# Patient Record
Sex: Male | Born: 1940 | Race: White | Hispanic: No | Marital: Married | Smoking: Never smoker
Health system: Southern US, Community
[De-identification: ages and names within clinical notes are randomized; demographics above are authoritative.]

## PROBLEM LIST (undated history)

## (undated) DIAGNOSIS — E785 Hyperlipidemia, unspecified: Secondary | ICD-10-CM

## (undated) DIAGNOSIS — D369 Benign neoplasm, unspecified site: Secondary | ICD-10-CM

## (undated) DIAGNOSIS — I10 Essential (primary) hypertension: Secondary | ICD-10-CM

## (undated) HISTORY — DX: Essential (primary) hypertension: I10

## (undated) HISTORY — DX: Hyperlipidemia, unspecified: E78.5

## (undated) HISTORY — DX: Benign neoplasm, unspecified site: D36.9

---

## 1981-10-30 HISTORY — PX: APPENDECTOMY: SHX54

## 2004-10-30 DIAGNOSIS — D369 Benign neoplasm, unspecified site: Secondary | ICD-10-CM

## 2004-10-30 HISTORY — DX: Benign neoplasm, unspecified site: D36.9

## 2005-02-08 ENCOUNTER — Ambulatory Visit: Payer: Self-pay | Admitting: Gastroenterology

## 2005-02-20 ENCOUNTER — Ambulatory Visit: Payer: Self-pay | Admitting: Gastroenterology

## 2005-07-06 ENCOUNTER — Encounter: Admission: RE | Admit: 2005-07-06 | Discharge: 2005-07-06 | Payer: Self-pay | Admitting: Family Medicine

## 2006-10-25 ENCOUNTER — Ambulatory Visit: Payer: Self-pay | Admitting: Family Medicine

## 2006-10-25 LAB — CONVERTED CEMR LAB
ALT: 28 units/L (ref 0–40)
AST: 27 units/L (ref 0–37)
Alkaline Phosphatase: 42 units/L (ref 39–117)
Basophils Relative: 0.1 % (ref 0.0–1.0)
CO2: 30 meq/L (ref 19–32)
Cholesterol: 145 mg/dL (ref 0–200)
Creatinine, Ser: 0.9 mg/dL (ref 0.4–1.5)
Creatinine,U: 218.9 mg/dL
GFR calc non Af Amer: 91 mL/min
Glomerular Filtration Rate, Af Am: 110 mL/min/{1.73_m2}
HCT: 44.1 % (ref 39.0–52.0)
HDL: 44.6 mg/dL (ref >39.0)
Hgb A1c MFr Bld: 6 % (ref 4.6–6.0)
Lymphocytes Relative: 23.8 % (ref 12.0–46.0)
MCV: 88.5 fL (ref 78.0–100.0)
Microalb Creat Ratio: 7.3 mg/g (ref 0.0–30.0)
Microalb, Ur: 1.6 mg/dL (ref 0.0–1.9)
Monocytes Absolute: 0.6 10*3/uL (ref 0.2–0.7)
Platelets: 243 10*3/uL (ref 150–400)
Potassium: 3.1 meq/L — ABNORMAL LOW (ref 3.5–5.1)
RDW: 13.2 % (ref 11.5–14.6)
Sodium: 137 meq/L (ref 135–145)
Total Bilirubin: 0.8 mg/dL (ref 0.3–1.2)
Total Protein: 6.7 g/dL (ref 6.0–8.3)
Triglyceride fasting, serum: 96 mg/dL (ref 0–149)
VLDL: 19 mg/dL (ref 0–40)

## 2006-11-08 ENCOUNTER — Ambulatory Visit: Payer: Self-pay | Admitting: Family Medicine

## 2006-11-12 ENCOUNTER — Ambulatory Visit: Payer: Self-pay | Admitting: Internal Medicine

## 2007-11-25 ENCOUNTER — Ambulatory Visit: Payer: Self-pay | Admitting: Family Medicine

## 2007-11-25 LAB — CONVERTED CEMR LAB
Albumin: 3.7 g/dL (ref 3.5–5.2)
Alkaline Phosphatase: 41 units/L (ref 39–117)
BUN: 12 mg/dL (ref 6–23)
Basophils Absolute: 0 10*3/uL (ref 0.0–0.1)
CO2: 30 meq/L (ref 19–32)
Chloride: 99 meq/L (ref 96–112)
Creatinine,U: 106.1 mg/dL
GFR calc non Af Amer: 90 mL/min
Glucose, Bld: 142 mg/dL — ABNORMAL HIGH (ref 70–99)
Glucose, Urine, Semiquant: NEGATIVE
Hemoglobin: 14.3 g/dL (ref 13.0–17.0)
Hgb A1c MFr Bld: 6.6 % — ABNORMAL HIGH (ref 4.6–6.0)
LDL Cholesterol: 125 mg/dL — ABNORMAL HIGH (ref 0–99)
Lymphocytes Relative: 24 % (ref 12.0–46.0)
MCHC: 32.9 g/dL (ref 30.0–36.0)
MCV: 88.1 fL (ref 78.0–100.0)
Microalb Creat Ratio: 80.1 mg/g — ABNORMAL HIGH (ref 0.0–30.0)
Microalb, Ur: 8.5 mg/dL — ABNORMAL HIGH (ref 0.0–1.9)
Monocytes Relative: 7.2 % (ref 3.0–11.0)
Neutro Abs: 4.5 10*3/uL (ref 1.4–7.7)
Potassium: 4.1 meq/L (ref 3.5–5.1)
RBC: 4.94 M/uL (ref 4.22–5.81)
RDW: 13.4 % (ref 11.5–14.6)
Sodium: 138 meq/L (ref 135–145)
Specific Gravity, Urine: 1.02
TSH: 1.3 microintl units/mL (ref 0.35–5.50)
Total CHOL/HDL Ratio: 5.2
Triglycerides: 112 mg/dL (ref 0–149)
VLDL: 22 mg/dL (ref 0–40)
pH: 7

## 2007-11-28 ENCOUNTER — Ambulatory Visit: Payer: Self-pay | Admitting: Family Medicine

## 2007-11-28 DIAGNOSIS — E785 Hyperlipidemia, unspecified: Secondary | ICD-10-CM | POA: Insufficient documentation

## 2007-11-28 DIAGNOSIS — IMO0002 Reserved for concepts with insufficient information to code with codable children: Secondary | ICD-10-CM

## 2007-11-28 DIAGNOSIS — E119 Type 2 diabetes mellitus without complications: Secondary | ICD-10-CM

## 2008-03-31 ENCOUNTER — Ambulatory Visit: Payer: Self-pay | Admitting: Family Medicine

## 2008-05-12 ENCOUNTER — Ambulatory Visit: Payer: Self-pay | Admitting: Family Medicine

## 2008-05-12 LAB — CONVERTED CEMR LAB: Hgb A1c MFr Bld: 7 % — ABNORMAL HIGH (ref 4.6–6.0)

## 2008-07-14 ENCOUNTER — Encounter: Payer: Self-pay | Admitting: Family Medicine

## 2008-07-14 ENCOUNTER — Ambulatory Visit: Payer: Self-pay | Admitting: Family Medicine

## 2008-07-27 DIAGNOSIS — J0110 Acute frontal sinusitis, unspecified: Secondary | ICD-10-CM

## 2008-07-30 ENCOUNTER — Ambulatory Visit: Payer: Self-pay | Admitting: Family Medicine

## 2008-07-30 DIAGNOSIS — J320 Chronic maxillary sinusitis: Secondary | ICD-10-CM | POA: Insufficient documentation

## 2008-08-31 ENCOUNTER — Telehealth: Payer: Self-pay | Admitting: Family Medicine

## 2008-12-22 ENCOUNTER — Telehealth: Payer: Self-pay | Admitting: Family Medicine

## 2008-12-30 ENCOUNTER — Ambulatory Visit: Payer: Self-pay | Admitting: Family Medicine

## 2009-03-09 ENCOUNTER — Telehealth: Payer: Self-pay | Admitting: Family Medicine

## 2009-06-24 ENCOUNTER — Ambulatory Visit: Payer: Self-pay | Admitting: Family Medicine

## 2009-06-24 LAB — CONVERTED CEMR LAB
BUN: 16 mg/dL (ref 6–23)
Calcium: 8.8 mg/dL (ref 8.4–10.5)
Glucose, Bld: 123 mg/dL — ABNORMAL HIGH (ref 70–99)

## 2009-07-02 ENCOUNTER — Telehealth: Payer: Self-pay | Admitting: Family Medicine

## 2009-11-18 ENCOUNTER — Telehealth: Payer: Self-pay | Admitting: Family Medicine

## 2009-11-23 ENCOUNTER — Telehealth: Payer: Self-pay | Admitting: Family Medicine

## 2010-02-07 ENCOUNTER — Encounter (INDEPENDENT_AMBULATORY_CARE_PROVIDER_SITE_OTHER): Payer: Self-pay | Admitting: *Deleted

## 2010-03-30 ENCOUNTER — Encounter: Payer: Self-pay | Admitting: Family Medicine

## 2010-06-28 ENCOUNTER — Ambulatory Visit: Payer: Self-pay | Admitting: Family Medicine

## 2010-06-28 LAB — CONVERTED CEMR LAB
ALT: 33 units/L (ref 0–53)
AST: 28 units/L (ref 0–37)
Basophils Absolute: 0.1 10*3/uL (ref 0.0–0.1)
CO2: 30 meq/L (ref 19–32)
Calcium: 9.4 mg/dL (ref 8.4–10.5)
Creatinine, Ser: 0.8 mg/dL (ref 0.4–1.5)
Creatinine,U: 316.9 mg/dL
Eosinophils Absolute: 0.2 10*3/uL (ref 0.0–0.7)
Eosinophils Relative: 2.5 % (ref 0.0–5.0)
Glucose, Bld: 121 mg/dL — ABNORMAL HIGH (ref 70–99)
HCT: 44.1 % (ref 39.0–52.0)
Lymphocytes Relative: 20 % (ref 12.0–46.0)
MCHC: 34.2 g/dL (ref 30.0–36.0)
Microalb, Ur: 3.4 mg/dL — ABNORMAL HIGH (ref 0.0–1.9)
Monocytes Absolute: 0.6 10*3/uL (ref 0.1–1.0)
Monocytes Relative: 7.1 % (ref 3.0–12.0)
Neutro Abs: 5.6 10*3/uL (ref 1.4–7.7)
Neutrophils Relative %: 69.7 % (ref 43.0–77.0)
Nitrite: NEGATIVE
PSA: 0.44 ng/mL (ref 0.10–4.00)
Platelets: 244 10*3/uL (ref 150.0–400.0)
Potassium: 4.1 meq/L (ref 3.5–5.1)
RBC: 4.87 M/uL (ref 4.22–5.81)
RDW: 13.8 % (ref 11.5–14.6)
Specific Gravity, Urine: 1.025
Total Bilirubin: 0.5 mg/dL (ref 0.3–1.2)
WBC Urine, dipstick: NEGATIVE
pH: 6

## 2010-07-12 ENCOUNTER — Ambulatory Visit: Payer: Self-pay | Admitting: Family Medicine

## 2010-07-15 ENCOUNTER — Encounter: Payer: Self-pay | Admitting: Family Medicine

## 2010-07-19 ENCOUNTER — Ambulatory Visit: Payer: Self-pay | Admitting: Family Medicine

## 2010-08-02 ENCOUNTER — Telehealth: Payer: Self-pay | Admitting: Family Medicine

## 2010-08-02 DIAGNOSIS — L820 Inflamed seborrheic keratosis: Secondary | ICD-10-CM

## 2010-08-29 ENCOUNTER — Encounter: Payer: Self-pay | Admitting: Family Medicine

## 2010-08-29 ENCOUNTER — Telehealth: Payer: Self-pay | Admitting: Family Medicine

## 2010-11-27 LAB — CONVERTED CEMR LAB
AST: 21 units/L (ref 0–37)
Albumin: 3.8 g/dL (ref 3.5–5.2)
Basophils Absolute: 0 10*3/uL (ref 0.0–0.1)
Basophils Relative: 0 % (ref 0.0–3.0)
Chloride: 100 meq/L (ref 96–112)
Cholesterol: 134 mg/dL (ref 0–200)
Creatinine,U: 340.8 mg/dL
Eosinophils Relative: 2.3 % (ref 0.0–5.0)
GFR calc Af Amer: 125 mL/min
Glucose, Bld: 105 mg/dL — ABNORMAL HIGH (ref 70–99)
HCT: 42.9 % (ref 39.0–52.0)
Hemoglobin: 14.4 g/dL (ref 13.0–17.0)
Hgb A1c MFr Bld: 6.2 % — ABNORMAL HIGH (ref 4.6–6.0)
LDL Cholesterol: 78 mg/dL (ref 0–99)
Lymphocytes Relative: 25.5 % (ref 12.0–46.0)
MCHC: 33.5 g/dL (ref 30.0–36.0)
MCV: 89.8 fL (ref 78.0–100.0)
Microalb, Ur: 2.3 mg/dL — ABNORMAL HIGH (ref 0.0–1.9)
Monocytes Absolute: 0.5 10*3/uL (ref 0.1–1.0)
Monocytes Relative: 7.5 % (ref 3.0–12.0)
Neutro Abs: 4.3 10*3/uL (ref 1.4–7.7)
PSA: 0.51 ng/mL (ref 0.10–4.00)
Platelets: 212 10*3/uL (ref 150–400)
Potassium: 3.6 meq/L (ref 3.5–5.1)
TSH: 0.99 microintl units/mL (ref 0.35–5.50)
Total Bilirubin: 0.8 mg/dL (ref 0.3–1.2)
Triglycerides: 83 mg/dL (ref 0–149)
Urobilinogen, UA: 1
pH: 6.5

## 2010-11-30 NOTE — Progress Notes (Signed)
  Phone Note Outgoing Call   Summary of Call: I called [name], and left him a message inflamed seborrheic keratosis benign lesion Initial call taken by: Roderick Pee MD,  August 02, 2010 8:21 AM

## 2010-11-30 NOTE — Assessment & Plan Note (Signed)
Summary: 1 wk rov/mm   Procedure Note Last Tetanus: Tdap (07/12/2010)  Mole Biopsy/Removal: Indication: rule out cancer Consent signed: yes  Procedure # 1: elliptical incision with 2 mm margin    Size (in cm): 1.0 x 1.0    Region: anterior    Location: scalp    Instrument used: #15 blade    Anesthesia: 1% lidocaine w/epinephrine    Closure: cautery  CC: mole removal   CC:  mole removal.  History of Present Illness: James [name] is a 70 year old, married male, nonsmoker, who comes in to have a lesion removed from his right hairline.Marland Kitchen  He is also noted to have hypertension.  His blood pressures not been under good control.  He was advised to check it daily and bring a record of all his blood pressure readings with him to however, is only checked it twice, and then bring a record of his readings nor the device.  We will ask him to repeat the process  Allergies: No Known Drug Allergies   Complete Medication List: 1)  Fish Oil Oil (Fish oil) 2)  Adult Aspirin Ec Low Strength 81 Mg Tbec (Aspirin) 3)  Verapamil Hcl Cr 240 Mg Cp24 (Verapamil hcl) .... Take 1 tablet by mouth once a day 4)  Mvi  5)  Hydrochlorothiazide 25 Mg Tabs (Hydrochlorothiazide) .... Take 1 tablet by mouth once a day 6)  Zestril 40 Mg Tabs (Lisinopril) .... Take 1 tablet by mouth every morning 7)  Onetouch Ultra Test Strp (Glucose blood) .... Test daily as directed 8)  Zocor 40 Mg Tabs (Simvastatin) .... Take one tab at bedtime 9)  Metformin Hcl 1000 Mg Tabs (Metformin hcl) .... Take 1 tablet by mouth two times a day  Other Orders: Shave Skin Lesion 0.6-1.0cm face/ears/eyelids/nose/lips/mm (11311)

## 2010-11-30 NOTE — Miscellaneous (Signed)
Summary: Consent for Mole Removal  Consent for Mole Removal   Imported By: Maryln Gottron 07/20/2010 14:43:12  _____________________________________________________________________  External Attachment:    Type:   Image     Comment:   External Document

## 2010-11-30 NOTE — Letter (Signed)
Summary: Colonoscopy Letter  [facility]  [address] [place], [place] [address]   Phone: [phone]  Fax: [phone]      February 07, 2010 MRN: [id number]   FAARIS ARIZPE [address] [place], [place]  [address]   Dear Mr. Finger,   According to your medical record, it is time for you to schedule a Colonoscopy. The American Cancer Society recommends this procedure as a method to detect early colon cancer. Patients with a family history of colon cancer, or a personal history of colon polyps or inflammatory bowel disease are at increased risk.  This letter has beeen generated based on the recommendations made at the time of your procedure. If you feel that in your particular situation this may no longer apply, please contact our office.  Please call our office at [phone] to schedule this appointment or to update your records at your earliest convenience.  Thank you for cooperating with [place] to provide you with the very best care possible.   Sincerely,  Judie Petit T. Russella Dar, M.D.  [facility] [phone]

## 2010-11-30 NOTE — Medication Information (Signed)
Summary: Order for Diabetic Testing Supplies  Order for Diabetic Testing Supplies   Imported By: Maryln Gottron 04/01/2010 14:49:04  _____________________________________________________________________  External Attachment:    Type:   Image     Comment:   External Document

## 2010-11-30 NOTE — Progress Notes (Signed)
Summary: Pt req refill of all meds to mail order rx  Phone Note Call from Patient Call back at Home Phone [phone]   Caller: spouse- Alice Summary of Call: Pts wife called and is req pts refills Metformin 500mg ,  Verapami 240mg  l, HCTZ 25mh , Simvastatin 40mg  , Lisinopril 40mg      - 90 day supply on all meds to [facility]. Their fax # is [phone] Initial call taken by: Lucy Antigua,  November 18, 2009 3:16 PM    Prescriptions: ZOCOR 40 MG TABS (SIMVASTATIN) take one tab at bedtime  #100 x 1   Entered by:   Kern Reap CMA (AAMA)   Authorized by:   Roderick Pee MD   Signed by:   Kern Reap CMA (AAMA) on 11/19/2009   Method used:   Faxed to ...       [facility] (mail-order)       [address]       [place], [place]  [id number]       Ph: [phone]       Fax: [phone]   RxID:   [id number] ZESTRIL 40 MG  TABS (LISINOPRIL) Take 1 tablet by mouth every morning  #100 x 1   Entered by:   Kern Reap CMA (AAMA)   Authorized by:   Roderick Pee MD   Signed by:   Kern Reap CMA (AAMA) on 11/19/2009   Method used:   Faxed to ...       [facility] (mail-order)       [address]       [place], [place]  [id number]       Ph: [phone]       Fax: [phone]   RxID:   [id number] GLUCOPHAGE 500 MG  TABS (METFORMIN HCL) Take 2 tablets in the morning and 1 tablet at bedtime  #300 x 1   Entered by:   Kern Reap CMA (AAMA)   Authorized by:   Roderick Pee MD   Signed by:   Kern Reap CMA (AAMA) on 11/19/2009   Method used:   Faxed to ...       [facility] (mail-order)       [address]       [place], [place]  [id number]       Ph: [phone]       Fax: [phone]   RxID:   [id number] HYDROCHLOROTHIAZIDE 25 MG  TABS (HYDROCHLOROTHIAZIDE) Take 1 tablet by mouth once a day  #100 x 1   Entered by:   Kern Reap CMA (AAMA)   Authorized by:   Roderick Pee MD   Signed by:   Kern Reap CMA (AAMA) on 11/19/2009   Method used:   Faxed to ...       [facility] (mail-order)       [address]       [place], [place]  [address]       Ph: [phone]       Fax: [phone]   RxID:   [id number] VERAPAMIL HCL CR 240 MG  CP24 (VERAPAMIL HCL) Take 1 tablet by mouth once a day  #100 x 3   Entered by:   Kern Reap CMA (AAMA)   Authorized by:   Roderick Pee MD   Signed by:   Kern Reap CMA (AAMA) on 11/19/2009   Method used:   Faxed to .Marland KitchenMarland Kitchen  [facility] (mail-order)       [address]       [place], [place]  [id number]       Ph: [phone]       Fax: [phone]   RxID:   [id number]

## 2010-11-30 NOTE — Progress Notes (Signed)
Summary: refills  Phone Note Refill Request Message from:  Fax from Pharmacy on August 29, 2010 3:11 PM  Refills Requested: Medication #1:  VERAPAMIL HCL CR 240 MG  CP24 Take 1 tablet by mouth once a day  Medication #2:  HYDROCHLOROTHIAZIDE 25 MG  TABS Take 1 tablet by mouth once a day  Medication #3:  ZESTRIL 40 MG  TABS Take 1 tablet by mouth every morning  Medication #4:  ZOCOR 40 MG TABS take one tab at bedtime  Method Requested: Electronic Initial call taken by: Kern Reap CMA Duncan Dull),  August 29, 2010 3:11 PM    Prescriptions: METFORMIN HCL 1000 MG TABS (METFORMIN HCL) Take 1 tablet by mouth two times a day  #200 x 3   Entered by:   Kern Reap CMA (AAMA)   Authorized by:   Roderick Pee MD   Signed by:   Kern Reap CMA (AAMA) on 08/29/2010   Method used:   Faxed to ...       [facility] (mail-order)       [address]       [place], [place]  [address]       Ph: [phone]       Fax: [phone]   RxID:   [id number] ZOCOR 40 MG TABS (SIMVASTATIN) take one tab at bedtime  #100 x 3   Entered by:   Kern Reap CMA (AAMA)   Authorized by:   Roderick Pee MD   Signed by:   Kern Reap CMA (AAMA) on 08/29/2010   Method used:   Faxed to ...       [facility] (mail-order)       [address]       [place], [place]  [id number]       Ph: [phone]       Fax: [phone]   RxID:   [id number] ZESTRIL 40 MG  TABS (LISINOPRIL) Take 1 tablet by mouth every morning  #100 x 3   Entered by:   Kern Reap CMA (AAMA)   Authorized by:   Roderick Pee MD   Signed by:   Kern Reap CMA (AAMA) on 08/29/2010   Method used:   Faxed to ...       [facility] (mail-order)       [address]       [place], [place]  [address]       Ph: [phone]       Fax: [phone]   RxID:   [id number] HYDROCHLOROTHIAZIDE 25 MG  TABS (HYDROCHLOROTHIAZIDE) Take 1 tablet by mouth once a day  #100 x 3   Entered by:    Kern Reap CMA (AAMA)   Authorized by:   Roderick Pee MD   Signed by:   Kern Reap CMA (AAMA) on 08/29/2010   Method used:   Faxed to ...       [facility] (mail-order)       [address]       [place], [place]  [address]       Ph: [phone]       Fax: [phone]   RxID:   [id number] VERAPAMIL HCL CR 240 MG  CP24 (VERAPAMIL HCL) Take 1 tablet by mouth once a day  #100 x 3   Entered by:   Kern Reap CMA (AAMA)   Authorized by:   Roderick Pee MD   Signed by:   Kern Reap CMA (  AAMA) on 08/29/2010   Method used:   Faxed to ...       [facility] (mail-order)       [address]       [place], [place]  [id number]       Ph: [phone]       Fax: [phone]   RxID:   [id number]

## 2010-11-30 NOTE — Miscellaneous (Signed)
Summary: Annual Exam Report  Clinical Lists Changes  Observations: Added new observation of EYES COMMENT: 07/2011 (07/15/2010 15:58) Added new observation of EYE EXAM BY: Jacalyn Lefevre, OD (07/05/2010 16:00) Added new observation of DMEYEEXMRES: normal (07/05/2010 16:00) Added new observation of DIAB EYE EX: normal (07/05/2010 16:00)        Diabetes Management History:      He says that he is exercising.    Diabetes Management Exam:    Eye Exam:       Eye Exam done elsewhere          Date: 07/05/2010          Results: normal          Done by: Jacalyn Lefevre, OD

## 2010-11-30 NOTE — Assessment & Plan Note (Signed)
Summary: fu on bs/njr   Vital Signs:  Patient profile:   70 year old male Height:      72 inches Weight:      223 pounds BMI:     30.35 Temp:     98.1 degrees F oral BP sitting:   120 / 84  (left arm) Cuff size:   regular  Vitals Entered By: Kern Reap CMA Duncan Dull) (June 28, 2010 11:04 AM) CC: elevated glucose  Flu Vaccine Consent Questions     Do you have a history of severe allergic reactions to this vaccine? no    Any prior history of allergic reactions to egg and/or gelatin? no    Do you have a sensitivity to the preservative Thimersol? no    Do you have a past history of Guillan-Barre Syndrome? no    Do you currently have an acute febrile illness? no    Have you ever had a severe reaction to latex? no    Vaccine information given and explained to patient? yes    Are you currently pregnant? no    Lot Number:[id number]   Exp Date:04/29/2011   Site Given  Left Deltoid IM  CC:  elevated glucose.  History of Present Illness: James [name] is a 70 year old, married man nonsmoker, who has been reluctant to come in on a regular basis, who comes in today accompanied by his wife for evaluation of elevated blood sugar.  His last physical exam and lab work was two years ago.  He was asked to come back for follow-up and did not come back.  He states he increased his metformin 1000 mg b.i.d. because his blood sugar up to [address]150 range.  His weight is stable at 223.  He's not been following a good diet nor has he been exercising on a regular basis.  We discussed risks, options.  He elects to try a 20 minute walk a day and continue the Glucophage 1000 mg b.i.d. monitor his blood sugar and come back in two weeks for complete physical examination    Allergies: No Known Drug Allergies  Past History:  Past medical, surgical, family and social histories (including risk factors) reviewed for relevance to current acute and chronic problems.  Past Medical History: Reviewed history from  11/28/2007 and no changes required. appendectomy hypertension diabetes type II hyperlipidemia obesity ex-smoker 2000 a  Family History: Reviewed history from 11/28/2007 and no changes required. father died 59, MI, alcoholic and diabetic  mother died 41.  Alzheimer's disease and diabetes, type II One brother one sister, both in good health  Social History: Reviewed history from 12/30/2008 and no changes required. Occupation: Married Former Smoker Alcohol use-yes Drug use-no Regular exercise-yes Retired  Review of Systems      See HPI  Physical Exam  General:  Well-developed,well-nourished,in no acute distress; alert,appropriate and cooperative throughout examination Skin:  seborrheic keratosis, right scalp area   Impression & Recommendations:  Problem # 1:  AODM (ICD-250.00) Assessment Deteriorated  The following medications were removed from the medication list:    Glucophage 500 Mg Tabs (Metformin hcl) .Marland Kitchen... Take 2 tablets in the morning and 1 tablet at bedtime His updated medication list for this problem includes:    Adult Aspirin Ec Low Strength 81 Mg Tbec (Aspirin)    Zestril 40 Mg Tabs (Lisinopril) .Marland Kitchen... Take 1 tablet by mouth every morning    Metformin Hcl 1000 Mg Tabs (Metformin hcl) .Marland Kitchen... Take 1 tablet by mouth two times a day  Orders: UA Dipstick  w/Micro (automated) (81001) Specimen Handling (08657) TLB-BMP (Basic Metabolic Panel-BMET) (80048-METABOL) TLB-CBC Platelet - w/Differential (85025-CBCD) TLB-Hepatic/Liver Function Pnl (80076-HEPATIC) TLB-TSH (Thyroid Stimulating Hormone) (84443-TSH) TLB-Microalbumin/Creat Ratio, Urine (82043-MALB) TLB-A1C / Hgb A1C (Glycohemoglobin) (83036-A1C) TLB-PSA (Prostate Specific Antigen) (84153-PSA) TLB-A1C / Hgb A1C (Glycohemoglobin) (83036-A1C)  Complete Medication List: 1)  Fish Oil Oil (Fish oil) 2)  Adult Aspirin Ec Low Strength 81 Mg Tbec (Aspirin) 3)  Verapamil Hcl Cr 240 Mg Cp24 (Verapamil hcl) .... Take  1 tablet by mouth once a day 4)  Mvi  5)  Hydrochlorothiazide 25 Mg Tabs (Hydrochlorothiazide) .... Take 1 tablet by mouth once a day 6)  Zestril 40 Mg Tabs (Lisinopril) .... Take 1 tablet by mouth every morning 7)  Onetouch Ultra [name] Strp (Glucose blood) .... [name] daily as directed 8)  Zocor 40 Mg Tabs (Simvastatin) .... Take one tab at bedtime 9)  Metformin Hcl 1000 Mg Tabs (Metformin hcl) .... Take 1 tablet by mouth two times a day  Other Orders: Flu Vaccine 63yrs + (84696) Administration Flu vaccine - MCR (G0008) Venipuncture (29528)  Patient Instructions: 1)  continue the Glucophage 1000 mg b.i.d., walk 20 minutes daily, check a fasting blood sugar once a day in the morning.  Return in two weeks for a 30 minute appointment for complete physical examination.  Bring a record of all your blood sugars back with you. Prescriptions: METFORMIN HCL 1000 MG TABS (METFORMIN HCL) Take 1 tablet by mouth two times a day  #200 x 3   Entered and Authorized by:   Roderick Pee MD   Signed by:   Roderick Pee MD on 06/28/2010   Method used:   Print then Give to Patient   RxID:   [id number] ZOCOR 40 MG TABS (SIMVASTATIN) take one tab at bedtime  #100 x 3   Entered and Authorized by:   Roderick Pee MD   Signed by:   Roderick Pee MD on 06/28/2010   Method used:   Print then Give to Patient   RxID:   [id number] ONETOUCH ULTRA [name]   STRP (GLUCOSE BLOOD) [name] daily as directed  #100 Each x 0   Entered and Authorized by:   Roderick Pee MD   Signed by:   Roderick Pee MD on 06/28/2010   Method used:   Print then Give to Patient   RxID:   [id number] ZESTRIL 40 MG  TABS (LISINOPRIL) Take 1 tablet by mouth every morning  #100 x 3   Entered and Authorized by:   Roderick Pee MD   Signed by:   Roderick Pee MD on 06/28/2010   Method used:   Print then Give to Patient   RxID:   [id number] HYDROCHLOROTHIAZIDE 25 MG  TABS (HYDROCHLOROTHIAZIDE) Take 1 tablet by mouth once a  day  #100 x 3   Entered and Authorized by:   Roderick Pee MD   Signed by:   Roderick Pee MD on 06/28/2010   Method used:   Print then Give to Patient   RxID:   [id number] VERAPAMIL HCL CR 240 MG  CP24 (VERAPAMIL HCL) Take 1 tablet by mouth once a day  #100 x 3   Entered and Authorized by:   Roderick Pee MD   Signed by:   Roderick Pee MD on 06/28/2010   Method used:   Print then Give to Patient   RxID:   [id number] ZOCOR 40 MG TABS (SIMVASTATIN) take one tab at  bedtime  #100 x 3   Entered and Authorized by:   Roderick Pee MD   Signed by:   Roderick Pee MD on 06/28/2010   Method used:   Electronically to        [facility]  [address]* (retail)       [address]       [place], [place]  [address]       Ph: [phone]       Fax: [phone]   RxID:   [id number] James [name]   STRP (GLUCOSE BLOOD) [name] daily as directed  #100 Each x 0   Entered and Authorized by:   Roderick Pee MD   Signed by:   Roderick Pee MD on 06/28/2010   Method used:   Electronically to        [facility]  [address]* (retail)       [address]       [place], [place]  [address]       Ph: [phone]       Fax: [phone]   RxID:   [id number] ZESTRIL 40 MG  TABS (LISINOPRIL) Take 1 tablet by mouth every morning  #100 x 3   Entered and Authorized by:   Roderick Pee MD   Signed by:   Roderick Pee MD on 06/28/2010   Method used:   Electronically to        [facility]  [address] (retail)       [address]       [place], [place]  [address]       Ph: [phone]       Fax: [phone]   RxID:   [id number] HYDROCHLOROTHIAZIDE 25 MG  TABS (HYDROCHLOROTHIAZIDE) Take 1 tablet by mouth once a day  #100 x 3   Entered and Authorized by:   Roderick Pee MD   Signed by:   Roderick Pee MD on 06/28/2010   Method used:   Electronically to        [facility]  [address]* (retail)       [address]       [place], [place]  [address]       Ph: [phone]       Fax:  [phone]   RxID:   [id number] VERAPAMIL HCL CR 240 MG  CP24 (VERAPAMIL HCL) Take 1 tablet by mouth once a day  #100 x 3   Entered and Authorized by:   Roderick Pee MD   Signed by:   Roderick Pee MD on 06/28/2010   Method used:   Electronically to        [facility]  [address]* (retail)       [address]       [place], [place]  [address]       Ph: [phone]       Fax: [phone]   RxID:   [id number] METFORMIN HCL 1000 MG TABS (METFORMIN HCL) Take 1 tablet by mouth two times a day  #200 x 3   Entered and Authorized by:   Roderick Pee MD   Signed by:   Roderick Pee MD on 06/28/2010   Method used:   Electronically to        [facility]  [phone]* (retail)       [address]       [place], [place]  [address]       Ph:  [phone]       Fax: [phone]   RxID:   [id number]     Laboratory Results   Urine Tests    Routine Urinalysis   Color: yellow Appearance: Clear Glucose: negative   (Normal Range: Negative) Bilirubin: 1+   (Normal Range: Negative) Ketone: trace (5)   (Normal Range: Negative) Spec. Gravity: 1.025   (Normal Range: 1.003-1.035) Blood: 2+   (Normal Range: Negative) pH: 6.0   (Normal Range: 5.0-8.0) Protein: trace   (Normal Range: Negative) Urobilinogen: 1.0   (Normal Range: 0-1) Nitrite: negative   (Normal Range: Negative) Leukocyte Esterace: negative   (Normal Range: Negative)    Comments: James [name]  June 28, 2010 1:56 PM

## 2010-11-30 NOTE — Progress Notes (Signed)
Summary: refaxed rx  Phone Note Call from Patient   Summary of Call: patient will need all rx refill refaxed Initial call taken by: Kern Reap CMA (AAMA),  November 23, 2009 1:25 PM    Prescriptions: ZOCOR 40 MG TABS (SIMVASTATIN) take one tab at bedtime  #100 x 3   Entered by:   Kern Reap CMA (AAMA)   Authorized by:   Roderick Pee MD   Signed by:   Kern Reap CMA (AAMA) on 11/23/2009   Method used:   Faxed to ...       [facility] (mail-order)       [address]       [place], [place]  [id number]       Ph: [phone]       Fax: [phone]   RxID:   [id number] [place] TEST   STRP (GLUCOSE BLOOD) test daily as directed  #100 x 6   Entered by:   Kern Reap CMA (AAMA)   Authorized by:   Roderick Pee MD   Signed by:   Kern Reap CMA (AAMA) on 11/23/2009   Method used:   Faxed to ...       [facility] (mail-order)       [address]       [place], [place]  [id number]       Ph: [phone]       Fax: [phone]   RxID:   [id number] ZESTRIL 40 MG  TABS (LISINOPRIL) Take 1 tablet by mouth every morning  #100 x 3   Entered by:   Kern Reap CMA (AAMA)   Authorized by:   Roderick Pee MD   Signed by:   Kern Reap CMA (AAMA) on 11/23/2009   Method used:   Faxed to ...       [facility] (mail-order)       [address]       [place], [place]  [id number]       Ph: [phone]       Fax: [phone]   RxID:   [id number] GLUCOPHAGE 500 MG  TABS (METFORMIN HCL) Take 2 tablets in the morning and 1 tablet at bedtime  #300 x 3   Entered by:   Kern Reap CMA (AAMA)   Authorized by:   Roderick Pee MD   Signed by:   Kern Reap CMA (AAMA) on 11/23/2009   Method used:   Faxed to ...       [facility] (mail-order)       [address]       [place], [place]  [address]       Ph: [phone]       Fax: [phone]   RxID:   [id number] HYDROCHLOROTHIAZIDE 25 MG  TABS  (HYDROCHLOROTHIAZIDE) Take 1 tablet by mouth once a day  #100 x 3   Entered by:   Kern Reap CMA (AAMA)   Authorized by:   Roderick Pee MD   Signed by:   Kern Reap CMA (AAMA) on 11/23/2009   Method used:   Faxed to ...       [facility] (mail-order)       [address]       [place], [place]  [address]       Ph: [phone]       Fax: [phone]   RxID:   [id number] VERAPAMIL HCL CR 240 MG  CP24 (VERAPAMIL  HCL) Take 1 tablet by mouth once a day  #100 x 3   Entered by:   Kern Reap CMA (AAMA)   Authorized by:   Roderick Pee MD   Signed by:   Kern Reap CMA (AAMA) on 11/23/2009   Method used:   Faxed to ...       [facility] (mail-order)       [address]       [place], [place]  [id number]       Ph: [phone]       Fax: [phone]   RxID:   [id number]

## 2010-11-30 NOTE — Medication Information (Signed)
Summary: Order for Diabetic Testing Supplies  Order for Diabetic Testing Supplies   Imported By: Maryln Gottron 09/01/2010 12:18:57  _____________________________________________________________________  External Attachment:    Type:   Image     Comment:   External Document

## 2010-11-30 NOTE — Assessment & Plan Note (Signed)
Summary: 30 MIN OV/NJR   Vital Signs:  Patient profile:   70 year old male Weight:      223 pounds Temp:     98.1 degrees F oral BP sitting:   140 / 90  (right arm) Cuff size:   regular  Vitals Entered By: Kathrynn Speed CMA (July 12, 2010 11:38 AM) CC: 30 min ov with lab review, src Is Patient Diabetic? Yes   CC:  30 min ov with lab review and src.  History of Present Illness: James [name] is a 70 year old male, who comes in today for evaluation of diabetes, hypertension, hyperlipidemia.  His hypertension is treated with verapamil 240 mg daily, or thiazide 25 mg daily BP here 140/90.  We will ask him to do some blood pressure monitoring at home.  He takes metformin 1000 mg b.i.d. blood sugar in the 110, 120 range.  Hemoglobin A1c7 .0%.  Recently, his blood sugars are down to 110, much better than previously, because he's been paying more attention to his diet and exercise.  He takes Zocor 40 mg nightly.  Lipids were done a year ago and were normal.  He also takes an 81-mg baby aspirin daily.  He gets routine eye care, dental care, a stew for follow-up colonoscopy.  Tetanus today.  Flu shot last week.  Review of systems negative except for lesion in his right forehead.  He would like removed.  Preventive Screening-Counseling & Management  Alcohol-Tobacco     Smoking Status: quit     Year Quit: 08  Current Medications (verified): 1)  Fish Oil   Oil (Fish Oil) 2)  Adult Aspirin Ec Low Strength 81 Mg  Tbec (Aspirin) 3)  Verapamil Hcl Cr 240 Mg  Cp24 (Verapamil Hcl) .... Take 1 Tablet By Mouth Once A Day 4)  Mvi 5)  Hydrochlorothiazide 25 Mg  Tabs (Hydrochlorothiazide) .... Take 1 Tablet By Mouth Once A Day 6)  Zestril 40 Mg  Tabs (Lisinopril) .... Take 1 Tablet By Mouth Every Morning 7)  Onetouch Ultra Test   Strp (Glucose Blood) .... Test Daily As Directed 8)  Zocor 40 Mg Tabs (Simvastatin) .... Take One Tab At Bedtime 9)  Metformin Hcl 1000 Mg Tabs (Metformin Hcl) ....  Take 1 Tablet By Mouth Two Times A Day  Allergies (verified): No Known Drug Allergies  Past History:  Past medical, surgical, family and social histories (including risk factors) reviewed, and no changes noted (except as noted below).  Past Medical History: Reviewed history from 11/28/2007 and no changes required. appendectomy hypertension diabetes type II hyperlipidemia obesity ex-smoker 2000 a  Family History: Reviewed history from 11/28/2007 and no changes required. father died 60, MI, alcoholic and diabetic  mother died 21.  Alzheimer's disease and diabetes, type II One brother one sister, both in good health  Social History: Reviewed history from 12/30/2008 and no changes required. Occupation: Married Former Smoker Alcohol use-yes Drug use-no Regular exercise-yes Retired  Physical Exam  General:  Well-developed,well-nourished,in no acute distress; alert,appropriate and cooperative throughout examination Head:  Normocephalic and atraumatic without obvious abnormalities. No apparent alopecia or balding. Eyes:  No corneal or conjunctival inflammation noted. EOMI. Perrla. Funduscopic exam benign, without hemorrhages, exudates or papilledema. Vision grossly normal. Ears:  External ear exam shows no significant lesions or deformities.  Otoscopic examination reveals clear canals, tympanic membranes are intact bilaterally without bulging, retraction, inflammation or discharge. Hearing is grossly normal bilaterally. Nose:  External nasal examination shows no deformity or inflammation. Nasal mucosa are pink and  moist without lesions or exudates. Mouth:  Oral mucosa and oropharynx without lesions or exudates.  Teeth in good repair. Abdomen:  Bowel sounds positive,abdomen soft and non-tender without masses, organomegaly or hernias noted. Rectal:  No external abnormalities noted. Normal sphincter tone. No rectal masses or tenderness. Genitalia:  Testes bilaterally descended  without nodularity, tenderness or masses. No scrotal masses or lesions. No penis lesions or urethral discharge. Prostate:  Prostate gland firm and smooth, no enlargement, nodularity, tenderness, mass, asymmetry or induration. Msk:  No deformity or scoliosis noted of thoracic or lumbar spine.   Pulses:  R and L carotid,radial,femoral,dorsalis pedis and posterior tibial pulses are full and equal bilaterally Extremities:  No clubbing, cyanosis, edema, or deformity noted with normal full range of motion of all joints.   Neurologic:  No cranial nerve deficits noted. Station and gait are normal. Plantar reflexes are down-going bilaterally. DTRs are symmetrical throughout. Sensory, motor and coordinative functions appear intact. Skin:  total body skin exam normal except for a elevated lesion right forehead consistent with a seborrheic keratosis, however, it's been irritated it's itching he would like it removed Cervical Nodes:  No lymphadenopathy noted Axillary Nodes:  No palpable lymphadenopathy Inguinal Nodes:  No significant adenopathy Psych:  Cognition and judgment appear intact. Alert and cooperative with normal attention span and concentration. No apparent delusions, illusions, hallucinations  Diabetes Management Exam:    Foot Exam (with socks and/or shoes not present):       Sensory-Pinprick/Light touch:          Left medial foot (L-4): normal          Left dorsal foot (L-5): normal          Left lateral foot (S-1): normal          Right medial foot (L-4): normal          Right dorsal foot (L-5): normal          Right lateral foot (S-1): normal       Sensory-Monofilament:          Left foot: normal          Right foot: normal       Inspection:          Left foot: normal          Right foot: normal       Nails:          Left foot: normal          Right foot: normal    Eye Exam:       Eye Exam done here today          Results: normal   Impression & Recommendations:  Problem # 1:   HYPERTENSION NEC (ICD-997.91) Assessment Improved  Problem # 2:  AODM (ICD-250.00) Assessment: Improved  His updated medication list for this problem includes:    Adult Aspirin Ec Low Strength 81 Mg Tbec (Aspirin)    Zestril 40 Mg Tabs (Lisinopril) .Marland Kitchen... Take 1 tablet by mouth every morning    Metformin Hcl 1000 Mg Tabs (Metformin hcl) .Marland Kitchen... Take 1 tablet by mouth two times a day  Problem # 3:  HEALTH SCREENING (ICD-V70.0) Assessment: Unchanged  Complete Medication List: 1)  Fish Oil Oil (Fish oil) 2)  Adult Aspirin Ec Low Strength 81 Mg Tbec (Aspirin) 3)  Verapamil Hcl Cr 240 Mg Cp24 (Verapamil hcl) .... Take 1 tablet by mouth once a day 4)  Mvi  5)  Hydrochlorothiazide 25  Mg Tabs (Hydrochlorothiazide) .... Take 1 tablet by mouth once a day 6)  Zestril 40 Mg Tabs (Lisinopril) .... Take 1 tablet by mouth every morning 7)  Onetouch Ultra Test Strp (Glucose blood) .... Test daily as directed 8)  Zocor 40 Mg Tabs (Simvastatin) .... Take one tab at bedtime 9)  Metformin Hcl 1000 Mg Tabs (Metformin hcl) .... Take 1 tablet by mouth two times a day  Other Orders: EKG w/ Interpretation (93000) Tdap => 61yrs IM (41324) Admin 1st Vaccine (40102)  Patient Instructions: 1)  set up a time next week to come in to have the lesion removed from her right forehead. 2)  Please schedule a follow-up appointment in 6 months. 3)  BMP prior to visit, ICD-9: 4)  HbgA1C prior to visit, ICD-9: 5)  It is important that you exercise regularly at least 20 minutes 5 times a week. If you develop chest pain, have severe difficulty breathing, or feel very tired , stop exercising immediately and seek medical attention. 6)  Schedule a colonoscopy/sigmoidoscopy to help detect colon cancer.   Immunizations Administered:  Tetanus Vaccine:    Vaccine Type: Tdap    Site: left deltoid    Mfr: Gl[employer]oSmithK[employer]    Dose: 0.5 ml    Route: IM    Given by: Kathrynn Speed CMA    Exp. Date: 07/20/2012    Lot #:  [id number]    VIS given: 09/16/08 version given July 12, 2010.    Orders Added: 1)  Est. Patient 65& > [99397] 2)  EKG w/ Interpretation [93000] 3)  Tdap => 38yrs IM [90715] 4)  Admin 1st Vaccine [47425]

## 2011-03-17 NOTE — Assessment & Plan Note (Signed)
[facility] OFFICE NOTE   NAME:James [name], James [name]                       MRN:          [id number]  DATE:11/08/2006                            DOB:          04/28/1941    Mr. 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   PAST MEDICAL HISTORY:  He has been hospitalized for an appendectomy.   OUTPATIENT SURGERY:  None.   ILLNESSES:  None.   INJURIES:  None.   DRUG ALLERGIES:  None.   He smokes and he smokes 15 cigarettes a day and he would like to quit.  He drinks a modest amount of alcohol, because alcoholism runs in his  family.   CURRENT MEDICATIONS:  1. Verapamil 240 mg daily.  2. Diovan 160 mg daily.  3. Hydrochlorothiazide 25 mg daily.  4. Lipitor 10 mg nightly.  5. 1 aspirin tablet daily.  He complains of a dry mouth. He thinks it is one of his medications. He  also takes over-the-counter fish oil to increase his HDL.   Blood pressure at home is running 150/85.   REVIEW OF SYSTEMS:  HEENT: Negative, except he wears glasses for distant  vision and he gets yearly eye exams. He gets regular dental care.  CARDIOPULMONARY: He has had a long standing history of hypertension and  has been on multiple medications as noted above. Blood pressure is not  at goal. His last chest x-ray was in 2005 and it was normal. Last EKG  was in 2005 and it was normal. However, at one point in time 5 years ago  when he had a physical exam by Dr. Andrey Campanile, he had an abnormal EKG. He  subsequently underwent a stress test and it was normal. Then in  hindsight, they said the abnormal EKG was probably due to electrical  malfunction of the machine. GI: Negative, except he has had a  colonoscopy x3, the last one was in 2006 and they found a couple of  polyps. He goes back for evaluation every 3-5 years. He has had a  urologic workup for blood in his urine and he was told it was  asymptomatic. ENDOCRINE: Pertinent at one point in time he was told by  Dr. Andrey Campanile he had diabetes. He had gone on a cruise and had eaten a lot  and gained some weight and came back to town. Blood sugar was 300. He  was put on a diabetic studies weight loss program and Avandia. He  stopped the Avandia and his blood sugar has subsequently has gone back  to normal. However, recently he has gained more weight and is concerned  that his blood sugar might be elevated, although he is asymptomatic. He  has an occasional back problem. It goes out. He treats this with  chiropractic care and that resolves it. The rest of the review of  systems is negative.   SOCIAL HISTORY:  He is married and lives in [place], [place]. 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   FAMILY HISTORY:  His father died at 43. He had mild alcoholism and  diabetes type-2. His mother died at 29. She had Alzheimer's disease and  diabetes type-2. He has 1 brother and 1 sister, both are in good health.   VACCINATION HISTORY:  His last tetanus was in 2005.   PHYSICAL EXAMINATION:  VITAL SIGNS: Height 6 foot even, weight 232,  blood pressure 176/98, I got 150/85, pulse 70 and regular. He is  afebrile.  GENERAL: He is a slightly overweight male in no acute distress.  HEENT: Negative.  NECK: Supple, thyroid is not enlarged, there were no carotid bruits.  CHEST: Clear to auscultation.  CARDIAC: Negative.  ABDOMEN: Negative.  GENITALIA: Normal male (he has no ED).  RECTAL: Normal.  STOOL: Guaiac negative.  PROSTATE: Normal.  EXTREMITIES: Normal skin, peripheral pulses normal.   LABORATORY DATA:  Shows an EKG which is read by the machine as positive  for an old MI. On further questioning, he has no history of any chest  pain, etc. Upon reviewing the EKG with Dr. Amador Cunas, he does not  think he had an old  MI. He thinks they are non-specific changes.  However, with his underlying hypertension, glucose intolerance, obesity  and his smoking, he is obviously at high risk. He will get him back for  ETT for followup and more evaluation. CBC was normal, as was the lipid  panel with an LDL of 81. Metabolic panel showed a potassium of 3.1,  blood sugar 128, and a hemoglobin A1C of 6. PSA and thyroid level were  normal. Urine showed 2+ blood which is consistent with what he  previously had. We will check old records.   IMPRESSION:  1. Hypertension, not at goal. Side effects dry mouth, the patient      would like to change medication. In addition, potassium of 3.1      would either require a potassium supplement or switching to      Diazide. The patient elects to switch to Diazide. Therefore, we      will discontinue the hydrochlorothiazide and start on Diazide 25 mg      daily. Discontinue the Diovan, start Zestril 20 mg daily. Continue      Verapamil 240 mg. He needs to check his blood pressure twice daily      and come back in 4 weeks for followup.  2. Hyperlipidemia, continue Lipitor 10 mg nightly. Consider switching      to Zocor 20 mg nightly for cost savings.  3. Abnormal blood sugar. He has been able to control his blood sugar      with diet, exercise and weight loss. However, now his weight has      gone up. His blood sugar is up. A1C is 6.   I explained to the patient that this glucose intolerance is like  pregnant, either you are or you are not and he indeed is pregnant. He  is diabetic, so he is going to be diligent with diet, exercise, and  weight loss. We will see him back in 3 months for followup at which time  we will get a fasting blood sugar and a hemoglobin A1C.   1. Obese. PLAN: Exercise, diet, and weight loss.  2. Tobacco abuse, outlined a smoking cessation program with [facility].  3. Status post appendectomy. 4. History of colon polyps, continue followup in GI  every 5  years.  5. History of hematuria, workup by urology, diagnosed to have      asymptomatic hematuria. Review old records.  6. Back pain, probably degenerative disc disease, but it resolves with      chiropractic care. We will continue that course p.r.n.   I explained to Mr. Stabenow that I am concerned about him, because of the  triad that he has with hyperlipidemia, hypertension, etc. Also, the  smoking gives him an extremely high risk for having a vascular event. He  understands this and would like to change his health habits. Therefore,  I outlined a game plan with diet, exercise, and weight loss and he is to  come back for an ETT next week with Dr. Purnell Shoemaker. Blood pressure followup in  4 weeks. Weight and glucose followup in 3 months.   An hour and 15 minutes was spent with the patient, reviewing all his  medical history and outlining a game plan as noted above.     Jeffrey A. Tawanna Cooler, MD  Electronically Signed    JAT/MedQ  DD: 11/08/2006  DT: 11/09/2006  Job #: [id number]

## 2011-03-17 NOTE — Assessment & Plan Note (Signed)
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   PAST MEDICAL HISTORY AND PAST HOSPITALIZATIONS:  Appendectomy.   OUTPATIENT SURGERY:  None.   ILLNESSES:  None.   INJURIES:  None.   MEDICATION ALLERGIES:  None.   He smokes and he smokes approximately a pack of cigarettes a day. He is  not drinking alcohol except for an occasional drink.   CURRENT MEDICATIONS:  1. Verapamil 240 daily.  2. Diovan 160 daily.  3. HCTZ 25 daily.  4. Lipitor 10 q.h.s.  5. He is not taking aspirin 1 a day but was advised to do that.   REVIEW OF SYSTEMS:  HEENT:  Negative except he wears glasses for  distance vision. ENT negative. He gets regular dental care.  CARDIOPULMONARY:  Negative. Last chest x-ray 2005, last EKG was 2006.  Blood pressures at home been 150/85. I explained to him that 135/85 was  our goal. GI:  Pertinent that he had a colonoscopy in 2006 and was found  to have some colon polyps. GI will followup. GU:  Negative except he has  had blood in his urine. He has had a urologic evaluation which was  negative. He was told he has asymptomatic hematuria. The rest of the  review of systems is negative.   SOCIAL HISTORY:  He is married and lives here in [place], [place]. He is a Hydrographic surveyor. 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   FAMILY HISTORY:  Dad died at 64 of MI, alcoholic and diabetic type 2.  Mother died at 66 of Alzheimer's and diabetes type 2. One brother in  good health, one sister in good health.   VACCINATION RECORD:  Last DT was 2005.   PHYSICAL  EXAMINATION:  VITAL SIGNS:  Height 6 foot even, weight 233.  Blood pressure was 176/84, repeat by me same. Pulse was 70 and regular,  he is afebrile.  GENERAL:  He is a well-developed, well-nourished, slightly overweight,  white male in no acute distress.  HEENT:  Negative.  NECK:  Supple. Thyroid was not enlarged. No carotid bruits.  CHEST:  Clear to auscultation.  CARDIAC:  Negative.  ABDOMEN:  Negative.  GENITALIA:  Normal circumcised male.  RECTAL:  Normal. Stool guaiac negative. Prostate normal.  EXTREMITIES:  Normal skin. Peripheral pulses normal.   LABORATORY DATA:  Shows a normal CBC. Cholesterol was total 145,  triglycerides 96, HDL 44, LDL was 81. Microalbumin was negative.  Potassium was 3.1. Blood sugar 127 with an A1c of 6.0. GFR was 91.  Thyroid and PSA were normal. Urine showed 2+ blood which is normal for  him.   IMPRESSION/PLAN:  1. Hypertension not at goal. Take medicines on a daily basis. Do blood      pressure checks twice a day, return in 1 month for followup.  2. Hyperlipidemia. Continue Lipitor 10 q.h.s. Add ASA 1 daily.  3. Tobacco abuse. Put him on a [facility] smoking cessation program.  4. Overweight. Counseled on diet, exercise and weight loss.  5. Status post appendectomy.  6. History of colon polyps. Followup in GI.   The patient is at high risk at age 36 because he has developed metabolic  syndrome. He has the obesity, hypertension, hyperlipidemia and the  smoking. Because of his high risk status, he will be set up for an ETT  with Dr. Kirtland Bouchard. This was done on November 12, 2006 and it was normal.   The patient will followup on his issues as noted above. Specifically  back in 4 weeks for followup of his hypertension. If daily medication  does not get blood pressure to goal then will have to change or add a  new medication or increase one of his existing drugs.     Jeffrey A. Tawanna Cooler, MD  Electronically Signed    JAT/MedQ  DD: 12/06/2006  DT: 12/06/2006   Job #: [id number]

## 2011-05-17 ENCOUNTER — Telehealth: Payer: Self-pay

## 2011-05-24 NOTE — Telephone Encounter (Signed)
Attempted to contact patient regarding scheduling recall Colonoscopy and left another message informing patient to return my call. Will mail another letter to patient.

## 2011-09-14 ENCOUNTER — Encounter: Payer: Self-pay | Admitting: Gastroenterology

## 2011-10-04 ENCOUNTER — Ambulatory Visit: Payer: Self-pay | Admitting: Gastroenterology

## 2011-10-06 ENCOUNTER — Other Ambulatory Visit: Payer: Self-pay | Admitting: Family Medicine

## 2011-11-07 ENCOUNTER — Encounter: Payer: Self-pay | Admitting: Gastroenterology

## 2011-12-18 ENCOUNTER — Ambulatory Visit: Payer: Self-pay | Admitting: Gastroenterology

## 2011-12-19 ENCOUNTER — Encounter: Payer: Self-pay | Admitting: Gastroenterology

## 2011-12-20 ENCOUNTER — Ambulatory Visit: Payer: Self-pay | Admitting: Family Medicine

## 2012-01-08 ENCOUNTER — Ambulatory Visit (AMBULATORY_SURGERY_CENTER): Payer: Medicare Other | Admitting: *Deleted

## 2012-01-08 VITALS — Ht 72.0 in | Wt 213.0 lb

## 2012-01-08 DIAGNOSIS — Z1211 Encounter for screening for malignant neoplasm of colon: Secondary | ICD-10-CM

## 2012-01-08 MED ORDER — PEG-KCL-NACL-NASULF-NA ASC-C 100 G PO SOLR: ORAL | Status: DC

## 2012-01-16 ENCOUNTER — Encounter: Payer: Self-pay | Admitting: Gastroenterology

## 2012-01-16 ENCOUNTER — Ambulatory Visit (AMBULATORY_SURGERY_CENTER): Payer: Medicare Other | Admitting: Gastroenterology

## 2012-01-16 VITALS — BP 130/51 | HR 56 | Temp 97.1°F | Resp 20 | Ht 72.0 in | Wt 213.0 lb

## 2012-01-16 DIAGNOSIS — D126 Benign neoplasm of colon, unspecified: Secondary | ICD-10-CM

## 2012-01-16 DIAGNOSIS — Z860100 Personal history of colon polyps, unspecified: Secondary | ICD-10-CM

## 2012-01-16 DIAGNOSIS — Z1211 Encounter for screening for malignant neoplasm of colon: Secondary | ICD-10-CM

## 2012-01-16 MED ORDER — SODIUM CHLORIDE 0.9 % IV SOLN: 500.0000 mL | INTRAVENOUS | Status: DC

## 2012-01-16 NOTE — Progress Notes (Signed)
Patient did not experience any of the following events: a burn prior to discharge; a fall within the facility; wrong site/side/patient/procedure/implant event; or a hospital transfer or hospital admission upon discharge from the facility. (G8907) Patient did not have preoperative order for IV antibiotic SSI prophylaxis. (G8918)  

## 2012-01-16 NOTE — Progress Notes (Signed)
PROPOFOL PER S CAMP CRNA. SEE SCANNED INTRA PROCEDURE REPORT. EWM 

## 2012-01-16 NOTE — Op Note (Signed)
[facility] [address] [place], [place]  [address]  COLONOSCOPY PROCEDURE REPORT  PATIENT:  James [name], James [name]  MR#:  [id number] BIRTHDATE:  Jan 01, 1941, 70 yrs. old  GENDER:  male ENDOSCOPIST:  Judie Petit T. Russella Dar, MD, [facility]  PROCEDURE DATE:  01/16/2012 PROCEDURE:  Colonoscopy with snare polypectomy ASA CLASS:  Class II INDICATIONS:  1) surveillance and high-risk screening  2) history of pre-cancerous (adenomatous) colon polyps: 01/2005 MEDICATIONS:   MAC sedation, administered by CRNA, propofol (Diprivan) 230 mg IV DESCRIPTION OF PROCEDURE:   After the risks benefits and alternatives of the procedure were thoroughly explained, informed consent was obtained.  Digital rectal exam was performed and revealed no abnormalities.   The LB 180AL [id number] endoscope was introduced through the anus and advanced to the cecum, which was identified by both the appendix and ileocecal valve, without limitations.  The quality of the prep was good, using MoviPrep. The instrument was then slowly withdrawn as the colon was fully examined. <<PROCEDUREIMAGES>> FINDINGS:  A sessile polyp was found in the sigmoid colon. It was 7 mm in size. Polyp was snared without cautery. Retrieval was successful. Moderate diverticulosis was found in the sigmoid colon. Otherwise normal colonoscopy without other polyps, masses, vascular ectasias, or inflammatory changes. Retroflexed views in the rectum revealed no abnormalities. The time to cecum =  3.5 minutes. The scope was then withdrawn (time =  10.25  min) from the patient and the procedure completed.  COMPLICATIONS:  None  ENDOSCOPIC IMPRESSION: 1) 7 mm sessile polyp in the sigmoid colon 2) Moderate diverticulosis in the sigmoid colon  RECOMMENDATIONS: 1) Await pathology results 2) High fiber diet with liberal fluid intake. 3) Repeat Colonoscopy in 5 years.  Venita Lick. Russella Dar, MD, [facility] Graham  n. eSIGNED:   Venita Lick. Raylei Losurdo at 01/16/2012 09:55 AM  Ysidro Evert, [id number]

## 2012-01-16 NOTE — Patient Instructions (Signed)
Findings Today:  Polyp and Diverticulosis  Recommendations:  High Fiber Diet and Liberal Fluid Intake  YOU HAD AN ENDOSCOPIC PROCEDURE TODAY AT THE [facility]: Refer to the procedure report that was given to you for any specific questions about what was found during the examination.  If the procedure report does not answer your questions, please call your gastroenterologist to clarify.  If you requested that your care partner not be given the details of your procedure findings, then the procedure report has been included in a sealed envelope for you to review at your convenience later.  YOU SHOULD EXPECT: Some feelings of bloating in the abdomen. Passage of more gas than usual.  Walking can help get rid of the air that was put into your GI tract during the procedure and reduce the bloating. If you had a lower endoscopy (such as a colonoscopy or flexible sigmoidoscopy) you may notice spotting of blood in your stool or on the toilet paper. If you underwent a bowel prep for your procedure, then you may not have a normal bowel movement for a few days.  DIET: Your first meal following the procedure should be a light meal and then it is ok to progress to your normal diet.  A half-sandwich or bowl of soup is an example of a good first meal.  Heavy or fried foods are harder to digest and may make you feel nauseous or bloated.  Likewise meals heavy in dairy and vegetables can cause extra gas to form and this can also increase the bloating.  Drink plenty of fluids but you should avoid alcoholic beverages for 24 hours.  ACTIVITY: Your care partner should take you home directly after the procedure.  You should plan to take it easy, moving slowly for the rest of the day.  You can resume normal activity the day after the procedure however you should NOT DRIVE or use heavy machinery for 24 hours (because of the sedation medicines used during the test).    SYMPTOMS TO REPORT IMMEDIATELY: A  gastroenterologist can be reached at any hour.  During normal business hours, 8:30 AM to 5:00 PM Monday through Friday, call [phone].  After hours and on weekends, please call the GI answering service at [phone] who will take a message and have the physician on call contact you.   Following lower endoscopy (colonoscopy or flexible sigmoidoscopy):  Excessive amounts of blood in the stool  Significant tenderness or worsening of abdominal pains  Swelling of the abdomen that is new, acute  Fever of 100F or higher  Following upper endoscopy (EGD)  Vomiting of blood or coffee ground material  New chest pain or pain under the shoulder blades  Painful or persistently difficult swallowing  New shortness of breath  Fever of 100F or higher  Black, tarry-looking stools  FOLLOW UP: If any biopsies were taken you will be contacted by phone or by letter within the next 1-3 weeks.  Call your gastroenterologist if you have not heard about the biopsies in 3 weeks.  Our staff will call the home number listed on your records the next business day following your procedure to check on you and address any questions or concerns that you may have at that time regarding the information given to you following your procedure. This is a courtesy call and so if there is no answer at the home number and we have not heard from you through the emergency physician on call, we will assume  that you have returned to your regular daily activities without incident.  SIGNATURES/CONFIDENTIALITY: You and/or your care partner have signed paperwork which will be entered into your electronic medical record.  These signatures attest to the fact that that the information above on your After Visit Summary has been reviewed and is understood.  Full responsibility of the confidentiality of this discharge information lies with you and/or your care-partner.  Please follow all discharge instructions given to you  by the recovery room nurse. If you have any questions or problems after discharge please call one of the numbers listed above. You will receive a phone call in the am to see how you are doing and answer any questions you may have. Thank you for choosing [facility] for your health care needs. 

## 2012-01-17 ENCOUNTER — Telehealth: Payer: Self-pay

## 2012-01-17 NOTE — Telephone Encounter (Signed)
  Follow up Call-  Call back number 01/16/2012  Post procedure Call Back phone  # [phone]  Permission to leave phone message Yes     Patient questions:  Do you have a fever, pain , or abdominal swelling? no Pain Score  0 *  Have you tolerated food without any problems? yes  Have you been able to return to your normal activities? yes  Do you have any questions about your discharge instructions: Diet   no Medications  no Follow up visit  no  Do you have questions or concerns about your Care? no  Actions: * If pain score is 4 or above: No action needed, pain <4.  Per the pt "I want to thank you all, you do a fantastic job up there". Maw

## 2012-01-22 ENCOUNTER — Encounter: Payer: Self-pay | Admitting: Gastroenterology

## 2012-01-25 ENCOUNTER — Ambulatory Visit: Payer: Self-pay | Admitting: Family Medicine

## 2012-02-21 ENCOUNTER — Ambulatory Visit: Payer: Self-pay | Admitting: Family Medicine

## 2012-03-04 ENCOUNTER — Encounter: Payer: Self-pay | Admitting: Family Medicine

## 2012-03-04 ENCOUNTER — Ambulatory Visit (INDEPENDENT_AMBULATORY_CARE_PROVIDER_SITE_OTHER): Payer: Medicare Other | Admitting: Family Medicine

## 2012-03-04 VITALS — BP 120/80 | Temp 97.9°F | Ht 72.0 in | Wt 213.0 lb

## 2012-03-04 DIAGNOSIS — E119 Type 2 diabetes mellitus without complications: Secondary | ICD-10-CM

## 2012-03-04 DIAGNOSIS — Z0000 Encounter for general adult medical examination without abnormal findings: Secondary | ICD-10-CM

## 2012-03-04 DIAGNOSIS — IMO0002 Reserved for concepts with insufficient information to code with codable children: Secondary | ICD-10-CM

## 2012-03-04 DIAGNOSIS — E785 Hyperlipidemia, unspecified: Secondary | ICD-10-CM

## 2012-03-04 DIAGNOSIS — N401 Enlarged prostate with lower urinary tract symptoms: Secondary | ICD-10-CM

## 2012-03-04 DIAGNOSIS — I10 Essential (primary) hypertension: Secondary | ICD-10-CM

## 2012-03-04 LAB — PSA: PSA: 0.78 ng/mL (ref 0.10–4.00)

## 2012-03-04 LAB — POCT URINALYSIS DIPSTICK
Bilirubin, UA: NEGATIVE
Leukocytes, UA: NEGATIVE
Nitrite, UA: NEGATIVE
Urobilinogen, UA: 0.2
pH, UA: 6.5

## 2012-03-04 LAB — CBC WITH DIFFERENTIAL/PLATELET
Basophils Absolute: 0.1 10*3/uL (ref 0.0–0.1)
Eosinophils Absolute: 0.2 10*3/uL (ref 0.0–0.7)
HCT: 44.2 % (ref 39.0–52.0)
Lymphs Abs: 1.6 10*3/uL (ref 0.7–4.0)
MCHC: 33.3 g/dL (ref 30.0–36.0)
Monocytes Absolute: 0.6 10*3/uL (ref 0.1–1.0)
Monocytes Relative: 7 % (ref 3.0–12.0)
Platelets: 238 10*3/uL (ref 150.0–400.0)
RDW: 14.2 % (ref 11.5–14.6)

## 2012-03-04 LAB — HEPATIC FUNCTION PANEL
ALT: 17 U/L (ref 0–53)
AST: 21 U/L (ref 0–37)
Alkaline Phosphatase: 47 U/L (ref 39–117)
Total Bilirubin: 0.7 mg/dL (ref 0.3–1.2)

## 2012-03-04 LAB — BASIC METABOLIC PANEL
BUN: 18 mg/dL (ref 6–23)
CO2: 30 mEq/L (ref 19–32)
Chloride: 99 mEq/L (ref 96–112)
Glucose, Bld: 104 mg/dL — ABNORMAL HIGH (ref 70–99)
Potassium: 4.2 mEq/L (ref 3.5–5.1)
Sodium: 140 mEq/L (ref 135–145)

## 2012-03-04 LAB — LIPID PANEL
HDL: 41.4 mg/dL (ref >39.00)
Triglycerides: 122 mg/dL (ref 0.0–149.0)

## 2012-03-04 MED ORDER — LISINOPRIL 40 MG PO TABS: 40.0000 mg | ORAL_TABLET | Freq: Every day | ORAL | Status: DC

## 2012-03-04 MED ORDER — SIMVASTATIN 40 MG PO TABS: 40.0000 mg | ORAL_TABLET | Freq: Every day | ORAL | Status: DC

## 2012-03-04 MED ORDER — HYDROCHLOROTHIAZIDE 25 MG PO TABS: 25.0000 mg | ORAL_TABLET | Freq: Every day | ORAL | Status: DC

## 2012-03-04 MED ORDER — VERAPAMIL HCL ER 240 MG PO CP24: 240.0000 mg | ORAL_CAPSULE | Freq: Every day | ORAL | Status: DC

## 2012-03-04 MED ORDER — METFORMIN HCL 1000 MG PO TABS: 1000.0000 mg | ORAL_TABLET | Freq: Two times a day (BID) | ORAL | Status: DC

## 2012-03-04 NOTE — Progress Notes (Signed)
  Subjective:    Patient ID: NIXXON FARIA, male    DOB: Nov 05, 1940, 71 y.o.   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   Review of Systems  Constitutional: Negative.   HENT: Negative.   Eyes: Negative.   Respiratory: Negative.   Cardiovascular: Negative.   Gastrointestinal: Negative.   Genitourinary: Negative.   Musculoskeletal: Negative.   Skin: Negative.   Neurological: Negative.   Hematological: Negative.   Psychiatric/Behavioral: Negative.        Objective:   Physical Exam  Constitutional: He is oriented to person, place, and time. He appears well-developed and well-nourished.  HENT:  Head: Normocephalic and atraumatic.  Right Ear: External ear normal.  Left Ear: External ear normal.  Nose: Nose normal.  Mouth/Throat: Oropharynx is clear and moist.  Eyes: Conjunctivae and EOM are normal. Pupils are equal, round, and reactive to light.  Neck: Normal range of motion. Neck supple. No JVD present. No tracheal deviation present. No thyromegaly present.  Cardiovascular: Normal rate, regular rhythm, normal heart sounds and intact distal pulses.  Exam reveals no gallop and no friction rub.   No murmur heard. Pulmonary/Chest: Effort normal and breath sounds normal. No stridor. No respiratory distress. He has no wheezes. He has no rales. He exhibits no tenderness.  Abdominal: Soft. Bowel sounds are normal. He exhibits no distension and  no mass. There is no tenderness. There is no rebound and no guarding.  Genitourinary: Rectum normal, prostate normal and penis normal. Guaiac negative stool. No penile tenderness.  Musculoskeletal: Normal range of motion. He exhibits no edema and no tenderness.  Lymphadenopathy:    He has no cervical adenopathy.  Neurological: He is alert and oriented to person, place, and time. He has normal reflexes. No cranial nerve deficit. He exhibits normal muscle tone.  Skin: Skin is warm and dry. No rash noted. No erythema. No pallor.  Psychiatric: He has a normal mood and affect. His behavior is normal. Judgment and thought content normal.          Assessment & Plan:  Healthy male  Diabetes type 2 at goal check labs  Hypertension at goal check labs  Hyperlipidemia goal check labs  New problem erectile dysfunction Cialis when necessary

## 2012-03-04 NOTE — Patient Instructions (Signed)
Continue your current medications  I will call you in a couple days to review your lab work at which time we will determine followup office visits  All your medications were renewed for her 4 year  Cialis one half tablet ,,,,,,,,,,,,,,, 1-2 hours prior to sex with water

## 2012-12-23 ENCOUNTER — Ambulatory Visit (INDEPENDENT_AMBULATORY_CARE_PROVIDER_SITE_OTHER): Payer: Medicare Other | Admitting: Family Medicine

## 2012-12-23 ENCOUNTER — Encounter: Payer: Self-pay | Admitting: Family Medicine

## 2012-12-23 VITALS — BP 140/90 | Temp 98.4°F | Wt 216.0 lb

## 2012-12-23 DIAGNOSIS — N529 Male erectile dysfunction, unspecified: Secondary | ICD-10-CM

## 2012-12-23 DIAGNOSIS — M545 Low back pain, unspecified: Secondary | ICD-10-CM

## 2012-12-23 DIAGNOSIS — R52 Pain, unspecified: Secondary | ICD-10-CM

## 2012-12-23 DIAGNOSIS — R109 Unspecified abdominal pain: Secondary | ICD-10-CM

## 2012-12-23 LAB — POCT URINALYSIS DIPSTICK
Spec Grav, UA: <=1.005
Urobilinogen, UA: 0.2
pH, UA: 6.5

## 2012-12-23 MED ORDER — SILDENAFIL CITRATE 50 MG PO TABS: 50.0000 mg | ORAL_TABLET | ORAL | Status: AC | PRN

## 2012-12-23 MED ORDER — HYDROCODONE-ACETAMINOPHEN 7.5-750 MG PO TABS: 1.0000 | ORAL_TABLET | Freq: Three times a day (TID) | ORAL | Status: DC | PRN

## 2012-12-23 MED ORDER — SILDENAFIL CITRATE 50 MG PO TABS: 50.0000 mg | ORAL_TABLET | ORAL | Status: DC | PRN

## 2012-12-23 NOTE — Patient Instructions (Addendum)
Drink lots of water  Strain all urines  Motrin 400 mg twice daily  Vicodin ES one tablet every 4-6 hours as needed for severe pain  We will call you and we get the report on your scan  Viagra 50 mg............. One half tab 2 hours prior to sex with water  If you do get a mild headache with it take 2 Tylenol

## 2012-12-23 NOTE — Progress Notes (Signed)
  Subjective:    Patient ID: James [name], male    DOB: 06/27/1941, 71 y.o.   MRN: [id number]  HPIBilly is a 72 year old male who comes in with one-week history of right flank pain  About a week ago he had the sudden onset of right flank pain since that time he comes and goes but it hasn't radiated. His urinalysis shows blood in his urine which he has had before  He said at one time he went to urologist who checked his records and since he had blood in his urine before did not do any diagnostic studies  He's been controlling his pain with 800 mg of Motrin twice daily    Review of Systems Review of systems otherwise negative    Objective:   Physical Exam Well-developed well-nourished male no acute distress urinalysis shows 2+ blood       Assessment & Plan:  Right flank pain with hematuria plan scan rule out kidney stone

## 2012-12-24 ENCOUNTER — Ambulatory Visit (INDEPENDENT_AMBULATORY_CARE_PROVIDER_SITE_OTHER)
Admission: RE | Admit: 2012-12-24 | Discharge: 2012-12-24 | Disposition: A | Discharge status: Home / Self Care | Payer: Medicare Other | Source: Ambulatory Visit | Attending: Family Medicine | Admitting: Family Medicine

## 2012-12-24 DIAGNOSIS — R109 Unspecified abdominal pain: Secondary | ICD-10-CM

## 2012-12-24 DIAGNOSIS — R52 Pain, unspecified: Secondary | ICD-10-CM

## 2013-01-20 ENCOUNTER — Ambulatory Visit (INDEPENDENT_AMBULATORY_CARE_PROVIDER_SITE_OTHER): Payer: Medicare Other | Admitting: Family Medicine

## 2013-01-20 VITALS — BP 150/88 | Temp 97.3°F | Wt 216.0 lb

## 2013-01-20 DIAGNOSIS — M545 Low back pain: Secondary | ICD-10-CM

## 2013-01-20 NOTE — Patient Instructions (Addendum)

## 2013-01-20 NOTE — Progress Notes (Signed)
  Subjective:    Patient ID: James [name], male    DOB: 1941-05-15, 72 y.o.   MRN: [id number]  HPI Patient seen with some persistent right lumbar back pain Seen in February with similar. CT scan then revealed no acute abnormality. No kidney stones. No radiculopathy symptoms. No known injury. Pain occasionally radiates occasional anterior. No associated rashes. Movement worsens. Only 3/10 intensity at this time and had been 7/10 Hydrocodone and Advil help. Changing positions exacerbates pain  Past Medical History  Diagnosis Date  . Diabetes mellitus   . Hyperlipidemia   . Hypertension   . Adenomatous polyp 2006   Past Surgical History  Procedure Laterality Date  . Appendectomy  1983    reports that he has never smoked. He has never used smokeless tobacco. He reports that he does not drink alcohol or use illicit drugs. family history is not on file. No Known Allergies    Review of Systems  Constitutional: Negative for fever, activity change and appetite change.  Respiratory: Negative for cough and shortness of breath.   Cardiovascular: Negative for chest pain and leg swelling.  Gastrointestinal: Negative for vomiting and abdominal pain.  Genitourinary: Negative for dysuria, hematuria and flank pain.  Musculoskeletal: Positive for back pain. Negative for joint swelling.  Neurological: Negative for weakness and numbness.       Objective:   Physical Exam  Constitutional: He appears well-developed and well-nourished.  Cardiovascular: Normal rate and regular rhythm.   Pulmonary/Chest: Effort normal and breath sounds normal. No respiratory distress. He has no wheezes.  Abdominal: Soft. He exhibits no distension. There is no tenderness. There is no rebound and no guarding.  Musculoskeletal: He exhibits no edema.  Straight leg raise is negative Patient able to flex back and extend without difficulty Minimal tenderness right lower lumbar region to palpation  Neurological:   Full-strength lower extremities. Symmetric reflexes  Skin: No rash noted.          Assessment & Plan:  Right lumbar back pain. Improving. Recent CT scan unremarkable. Nonfocal exam. Suspect musculoskeletal. Continue stretches, Advil as needed. Discussed physical therapy but he wishes to wait since improving

## 2013-02-17 ENCOUNTER — Other Ambulatory Visit: Payer: Self-pay | Admitting: Family Medicine

## 2013-03-12 ENCOUNTER — Ambulatory Visit (INDEPENDENT_AMBULATORY_CARE_PROVIDER_SITE_OTHER): Payer: Medicare Other | Admitting: Family Medicine

## 2013-03-12 ENCOUNTER — Encounter: Payer: Self-pay | Admitting: Family Medicine

## 2013-03-12 VITALS — BP 130/90 | Temp 97.9°F | Wt 211.0 lb

## 2013-03-12 DIAGNOSIS — N138 Other obstructive and reflux uropathy: Secondary | ICD-10-CM

## 2013-03-12 DIAGNOSIS — IMO0002 Reserved for concepts with insufficient information to code with codable children: Secondary | ICD-10-CM

## 2013-03-12 DIAGNOSIS — R52 Pain, unspecified: Secondary | ICD-10-CM

## 2013-03-12 DIAGNOSIS — R109 Unspecified abdominal pain: Secondary | ICD-10-CM

## 2013-03-12 DIAGNOSIS — E119 Type 2 diabetes mellitus without complications: Secondary | ICD-10-CM

## 2013-03-12 DIAGNOSIS — E785 Hyperlipidemia, unspecified: Secondary | ICD-10-CM

## 2013-03-12 DIAGNOSIS — N401 Enlarged prostate with lower urinary tract symptoms: Secondary | ICD-10-CM

## 2013-03-12 LAB — CBC WITH DIFFERENTIAL/PLATELET
Eosinophils Relative: 2.5 % (ref 0.0–5.0)
HCT: 42.5 % (ref 39.0–52.0)
Hemoglobin: 14.5 g/dL (ref 13.0–17.0)
Lymphs Abs: 1.8 10*3/uL (ref 0.7–4.0)
Monocytes Relative: 6.2 % (ref 3.0–12.0)
Platelets: 236 10*3/uL (ref 150.0–400.0)
RBC: 4.76 Mil/uL (ref 4.22–5.81)
WBC: 10.5 10*3/uL (ref 4.5–10.5)

## 2013-03-12 LAB — PSA: PSA: 0.49 ng/mL (ref 0.10–4.00)

## 2013-03-12 LAB — BASIC METABOLIC PANEL
BUN: 18 mg/dL (ref 6–23)
Chloride: 99 mEq/L (ref 96–112)
Potassium: 3.8 mEq/L (ref 3.5–5.1)

## 2013-03-12 LAB — LIPID PANEL
HDL: 34.4 mg/dL — ABNORMAL LOW (ref >39.00)
LDL Cholesterol: 59 mg/dL (ref 0–99)
Total CHOL/HDL Ratio: 4
VLDL: 35 mg/dL (ref 0.0–40.0)

## 2013-03-12 LAB — HEMOGLOBIN A1C: Hgb A1c MFr Bld: 7 % — ABNORMAL HIGH (ref 4.6–6.5)

## 2013-03-12 LAB — HEPATIC FUNCTION PANEL: Total Bilirubin: 0.6 mg/dL (ref 0.3–1.2)

## 2013-03-12 MED ORDER — TRAMADOL HCL 50 MG PO TABS: 50.0000 mg | ORAL_TABLET | Freq: Three times a day (TID) | ORAL | Status: AC | PRN

## 2013-03-12 NOTE — Progress Notes (Signed)
  Subjective:    Patient ID: James [name], male    DOB: 1941/06/26, 72 y.o.   MRN: [id number]  HPI Bili is a 72 year old married male nonsmoker who comes in today for evaluation of right flank pain  We saw him a couple months ago with this problem. Examination was normal. CT scan of his abdomen is normal. Since that time the pain which was intermittent and now has become constant. He describes it as constant, dull to sharp, a 5 on a scale of 1-10. It increases with coughing and sneezing and walking decreases with lying down and rest. It does not keep him awake at night. He's taking over-the-counter Motrin with some relief. He's also noticed nocturia x4 recently.  He had a recent colonoscopy a year ago which was normal  He's had no fever vomiting diarrhea change in urinary habits except for the nocturia. No night sweats etc. Family history noncontributory  Review of Systems    review of systems otherwise negative except for nocturia Objective:   Physical Exam Well-developed and nourished male no acute distress examination the spine was normal. There is tenderness in the right flank no palpable masses. In the supine position both legs were of equal length. Sensation muscle strength reflexes pulses all normal. Straight leg raising negative. Abdominal exam negative       Assessment & Plan:  Right flank pain x4 months plan labs an MRI

## 2013-03-12 NOTE — Patient Instructions (Signed)
Labs today  We will get you set up for an MRI of your abdomen ASAP  I will call you and I get the report on your labs and your MRI  You can safely take 400 mg of Motrin twice daily with food for the pain  Tramadol 50 mg,,,,,,,,,, one half to one tablet at bedtime when necessary for nighttime pain

## 2013-03-17 ENCOUNTER — Ambulatory Visit
Admission: RE | Admit: 2013-03-17 | Discharge: 2013-03-17 | Disposition: A | Discharge status: Home / Self Care | Payer: Medicare Other | Source: Ambulatory Visit | Attending: Family Medicine | Admitting: Family Medicine

## 2013-03-17 DIAGNOSIS — E785 Hyperlipidemia, unspecified: Secondary | ICD-10-CM

## 2013-03-17 DIAGNOSIS — IMO0002 Reserved for concepts with insufficient information to code with codable children: Secondary | ICD-10-CM

## 2013-03-17 DIAGNOSIS — R109 Unspecified abdominal pain: Secondary | ICD-10-CM

## 2013-03-17 DIAGNOSIS — E119 Type 2 diabetes mellitus without complications: Secondary | ICD-10-CM

## 2013-03-17 MED ORDER — GADOBENATE DIMEGLUMINE 529 MG/ML IV SOLN
20.0000 mL | Freq: Once | INTRAVENOUS | Status: AC | PRN
  Administered 2013-03-17: 20 mL via INTRAVENOUS

## 2013-03-19 ENCOUNTER — Other Ambulatory Visit: Payer: Self-pay | Admitting: Family Medicine

## 2013-03-19 DIAGNOSIS — R109 Unspecified abdominal pain: Secondary | ICD-10-CM

## 2013-04-29 ENCOUNTER — Other Ambulatory Visit: Payer: Self-pay | Admitting: Family Medicine

## 2013-06-04 ENCOUNTER — Other Ambulatory Visit: Payer: Self-pay

## 2013-06-06 ENCOUNTER — Telehealth: Payer: Self-pay | Admitting: Family Medicine

## 2013-06-06 NOTE — Telephone Encounter (Signed)
Patient Information:  Caller Name: Maykel  Phone: [phone]  Patient: James [name], James [name]  Gender: Male  DOB: October 18, 1941  Age: 72 Years  PCP: [facility])  Office Follow Up:  Does the office need to follow up with this patient?: No  Instructions For The Office: N/A   Symptoms  Reason For Call & Symptoms: Blood sugars elevated for the last month.  Fasting sugars 95 - 105 - 112 - 115.  Blood sugars after meals highest 180 -- today 8/8 blood sugar after lunch was 170.  Taking Metformin ordered 2 times per day with a meal and has been taking Rx one with dinner and one at HS.  Has lost 4-5 Lbs in the last 2-3 Montths, has loss of energy, feels weak at times.  Blurred vision intermittently for the last 5 years.  Worried since blood sugars seem to be going up at intervals.  Reviewed Health History In EMR: Yes  Reviewed Medications In EMR: Yes  Reviewed Allergies In EMR: Yes  Reviewed Surgeries / Procedures: Yes  Date of Onset of Symptoms: Unknown  Treatments Tried: Cut carbs  Treatments Tried Worked: No  Guideline(s) Used:  Diabetes - High Blood Sugar  Disposition Per Guideline:   See Today in Office  Reason For Disposition Reached:   Patient wants to be seen  Advice Given:  Treatment - Liquids  Generally, you should try to drink 6-8 glasses of water each day.  Call Back If:  You become worse.  Patient Will Follow Care Advice:  YES  Appointment Scheduled:  06/09/2013 09:15:00 Appointment Scheduled Provider:  Kriste Basque [facility])

## 2013-06-06 NOTE — Telephone Encounter (Signed)
Noted  

## 2013-06-09 ENCOUNTER — Encounter: Payer: Self-pay | Admitting: Family Medicine

## 2013-06-09 ENCOUNTER — Ambulatory Visit (INDEPENDENT_AMBULATORY_CARE_PROVIDER_SITE_OTHER): Payer: Medicare Other | Admitting: Family Medicine

## 2013-06-09 VITALS — BP 102/70 | Temp 97.9°F | Wt 205.0 lb

## 2013-06-09 DIAGNOSIS — E119 Type 2 diabetes mellitus without complications: Secondary | ICD-10-CM

## 2013-06-09 DIAGNOSIS — E785 Hyperlipidemia, unspecified: Secondary | ICD-10-CM

## 2013-06-09 DIAGNOSIS — IMO0002 Reserved for concepts with insufficient information to code with codable children: Secondary | ICD-10-CM

## 2013-06-09 MED ORDER — GLUCOSE BLOOD VI STRP: ORAL_STRIP | Status: DC

## 2013-06-09 NOTE — Progress Notes (Signed)
Chief Complaint  Patient presents with  . Blood Sugar Problem    HPI: James [name] is a 72 yo patient of Dr. Tawanna Cooler, here for a complaint of hyperglycemia: Lab Results  Component Value Date   HGBA1C 7.0* 03/12/2013  -meds: metformin 1000mg  -takes ASA -reports: doing well now, had some elevated postprandial levels last weeks (had one BS 180), but has been working on diet and now fastings have been in 90s, and postprandials are in 90-100 -denies: fevers, chills, nausea, vomiting, vision changes -diet/exercise: working on diet;   HTN/HLD: -on hctx, lisinopril, simvastatin, verapamil -denies: CP, SOB, DOE  ROS: See pertinent positives and negatives per HPI.  Past Medical History  Diagnosis Date  . Diabetes mellitus   . Hyperlipidemia   . Hypertension   . Adenomatous polyp 2006    No family history on file.  History   Social History  . Marital Status: Married    Spouse Name: N/A    Number of Children: N/A  . Years of Education: N/A   Social History Main Topics  . Smoking status: Never Smoker   . Smokeless tobacco: Never Used  . Alcohol Use: No  . Drug Use: No  . Sexually Active: None   Other Topics Concern  . None   Social History Narrative  . None    Current outpatient prescriptions:aspirin 81 MG tablet, Take 81 mg by mouth daily., Disp: , Rfl: ;  fish oil-omega-3 fatty acids 1000 MG capsule, Take 1 g by mouth daily., Disp: , Rfl: ;  hydrochlorothiazide (HYDRODIURIL) 25 MG tablet, TAKE 1 TABLET (25 MG TOTAL) BY MOUTH DAILY., Disp: 90 tablet, Rfl: 3;  lisinopril (PRINIVIL,ZESTRIL) 40 MG tablet, TAKE 1 TABLET EVERY DAY, Disp: 90 tablet, Rfl: 3 metFORMIN (GLUCOPHAGE) 1000 MG tablet, Take 1 tablet (1,000 mg total) by mouth 2 (two) times daily with a meal., Disp: 90 tablet, Rfl: 3;  sildenafil (VIAGRA) 50 MG tablet, Take 1 tablet (50 mg total) by mouth as needed for erectile dysfunction., Disp: 6 tablet, Rfl: 11;  simvastatin (ZOCOR) 40 MG tablet, TAKE 1 TABLET AT  BEDTIME, Disp: 90 tablet, Rfl: PRN traMADol (ULTRAM) 50 MG tablet, Take 1 tablet (50 mg total) by mouth every 8 (eight) hours as needed for pain., Disp: 30 tablet, Rfl: 0;  verapamil (VERELAN PM) 240 MG 24 hr capsule, TAKE 1 CAPSULE (240 MG TOTAL) BY MOUTH AT BEDTIME., Disp: 90 capsule, Rfl: 3;  glucose blood (ONE TOUCH ULTRA TEST) test strip, Test once daily., Disp: 100 each, Rfl: 12  EXAM:  Filed Vitals:   06/09/13 0903  BP: 102/70  Temp: 97.9 F (36.6 C)    Body mass index is 27.8 kg/(m^2).  GENERAL: vitals reviewed and listed above, alert, oriented, appears well hydrated and in no acute distress  HEENT: atraumatic, conjunttiva clear, no obvious abnormalities on inspection of external nose and ears  NECK: no obvious masses on inspection  LUNGS: clear to auscultation bilaterally, no wheezes, rales or rhonchi, good air movement  CV: HRRR, no peripheral edema  MS: moves all extremities without noticeable abnormality  PSYCH: pleasant and cooperative, no obvious depression or anxiety  ASSESSMENT AND PLAN:  Discussed the following assessment and plan:  Type II or unspecified type diabetes mellitus without mention of complication, not stated as uncontrolled - Plan: glucose blood (ONE TOUCH ULTRA TEST) test strip  HYPERLIPIDEMIA  HYPERTENSION NEC  -congratulated on lifestyle changes, advised to work on exercise -follow up with PCP in 1-2 months -Patient advised to return  or notify a doctor immediately if symptoms worsen or persist or new concerns arise.  There are no Patient Instructions on file for this visit.   Colin Benton R.

## 2013-07-21 ENCOUNTER — Telehealth: Payer: Self-pay | Admitting: Family Medicine

## 2013-07-21 DIAGNOSIS — E119 Type 2 diabetes mellitus without complications: Secondary | ICD-10-CM

## 2013-07-21 MED ORDER — GLUCOSE BLOOD VI STRP: ORAL_STRIP | Status: AC

## 2013-07-21 NOTE — Telephone Encounter (Signed)
Pt is calling to request that his RX of glucose blood (ONE TOUCH ULTRA TEST) test strip, be sent to [facility] in [place], [place]- [address]. He states that [facility] was unable to fill it. Please assist.

## 2013-07-21 NOTE — Addendum Note (Signed)
Addended by: Kern Reap B on: 07/21/2013 02:20 PM   Modules accepted: Orders

## 2013-08-07 ENCOUNTER — Other Ambulatory Visit: Payer: Self-pay | Admitting: Family Medicine

## 2013-08-26 ENCOUNTER — Telehealth: Payer: Self-pay | Admitting: Family Medicine

## 2013-08-26 NOTE — Telephone Encounter (Signed)
Pt received letter from [facility] stating taking : verapamil (VERELAN PM) 240 MG 24 hr capsule simvastatin (ZOCOR) 40 MG tablet Together can cause problems and he needs to discuss w/ MD. pls advise.  Pt refused to make an appt prior to speaking w/ someone

## 2013-08-27 NOTE — Telephone Encounter (Signed)
Fleet Contras please call Mr. Bergsma,,,,,,,,,, we are aware of the potential problems. He's not had any complications so we will continue the current medications. Explained to him this is what all the pharmacies are doing now

## 2013-08-27 NOTE — Telephone Encounter (Signed)
Patient states that he is now having shakes , like a nervous shake and would like to know if he can change his medication?

## 2013-08-28 NOTE — Telephone Encounter (Signed)
Patient has made a follow up appointment.

## 2013-08-28 NOTE — Telephone Encounter (Signed)
Neither of these medications can cause shaking that I know of. The best setting is to make an appointment and come in and let us reevaluate you

## 2013-09-03 ENCOUNTER — Encounter: Payer: Self-pay | Admitting: Family Medicine

## 2013-09-03 ENCOUNTER — Ambulatory Visit (INDEPENDENT_AMBULATORY_CARE_PROVIDER_SITE_OTHER): Payer: Medicare Other | Admitting: Family Medicine

## 2013-09-03 VITALS — BP 140/80 | Temp 98.1°F | Wt 214.0 lb

## 2013-09-03 DIAGNOSIS — N401 Enlarged prostate with lower urinary tract symptoms: Secondary | ICD-10-CM

## 2013-09-03 DIAGNOSIS — IMO0002 Reserved for concepts with insufficient information to code with codable children: Secondary | ICD-10-CM

## 2013-09-03 DIAGNOSIS — Z23 Encounter for immunization: Secondary | ICD-10-CM

## 2013-09-03 DIAGNOSIS — E119 Type 2 diabetes mellitus without complications: Secondary | ICD-10-CM

## 2013-09-03 LAB — BASIC METABOLIC PANEL
BUN: 16 mg/dL (ref 6–23)
CO2: 32 mEq/L (ref 19–32)
Chloride: 96 mEq/L (ref 96–112)
Creatinine, Ser: 1.1 mg/dL (ref 0.4–1.5)
GFR: 71.76 mL/min (ref >60.00)
Glucose, Bld: 167 mg/dL — ABNORMAL HIGH (ref 70–99)
Potassium: 3.6 mEq/L (ref 3.5–5.1)

## 2013-09-03 LAB — MICROALBUMIN / CREATININE URINE RATIO
Creatinine,U: 232.3 mg/dL
Microalb Creat Ratio: 1.2 mg/g (ref 0.0–30.0)
Microalb, Ur: 2.9 mg/dL — ABNORMAL HIGH (ref 0.0–1.9)

## 2013-09-03 LAB — PSA: PSA: 0.55 ng/mL (ref 0.10–4.00)

## 2013-09-03 LAB — HEMOGLOBIN A1C: Hgb A1c MFr Bld: 6.9 % — ABNORMAL HIGH (ref 4.6–6.5)

## 2013-09-03 NOTE — Patient Instructions (Signed)
Continue your current medication  I will call you I gets her lab work back 

## 2013-09-03 NOTE — Progress Notes (Signed)
  Subjective:    Patient ID: James [name], male    DOB: Mar 17, 1941, 72 y.o.   MRN: [id number]  HPI Genevie Cheshire is it 72 year old male who comes in today because he got a letter from his insurance company about the possible interaction between verapamil and simvastatin  He's otherwise asymptomatic his has no difficulties. Asymptomatic lab work normal  He is a type II diabetic which he manages with metformin 1000 mg twice a day. Last A1c 6 months ago 7.0%  He was also supposed to have a PSA last time but never got done. He does have some mild urinary tract symptoms with occasional nocturia   Review of Systems    review of systems otherwise negative Objective:   Physical Exam  Well-developed and nourished male no acute distress vital signs stable he is afebrile BP 140/80        Assessment & Plan:  Diabetes type 2 check labs  History of BPH with mild nocturia check labs  Potential interaction between for Actonel and simvastatin observe

## 2013-12-02 ENCOUNTER — Telehealth: Payer: Self-pay | Admitting: Family Medicine

## 2013-12-02 NOTE — Telephone Encounter (Signed)
Noted in chart.

## 2013-12-02 NOTE — Telephone Encounter (Signed)
Pt is calling regarding medication refills, need to advise that his pharmacy has changed to [facility] in [place] and just wanted the nurse to get the information on file.

## 2013-12-05 ENCOUNTER — Telehealth: Payer: Self-pay | Admitting: Family Medicine

## 2013-12-05 NOTE — Telephone Encounter (Signed)
Pt wife calling, pt needs rx for verapamil (VERELAN PM) 240 MG 24 hr capsule sent [facility] in [place]

## 2013-12-06 MED ORDER — VERAPAMIL HCL ER 240 MG PO CP24: ORAL_CAPSULE | ORAL | Status: DC

## 2014-01-27 ENCOUNTER — Telehealth: Payer: Self-pay | Admitting: Family Medicine

## 2014-01-27 NOTE — Telephone Encounter (Signed)
Pt need a RX for "diabetes medicine".  States metformin isn't helping. He saw a commercial for [place] and would like to know if he can try that with metformin??  If not that then something else. Pt is aware that Dr. Sherren Mocha is out of the office until next week.

## 2014-02-01 IMAGING — CT CT ABD-PELV W/O CM
2 of 4 series · 17 of 46 positions shown, 19 images · non-contrast
Comparison: 03/20/2012

CLINICAL DATA: Right flank pain and hematuria.

CT ABDOMEN AND PELVIS WITHOUT CONTRAST
TECHNIQUE: Multidetector CT imaging of the abdomen and pelvis was
performed following the standard protocol without intravenous
contrast.

[Series 2: ap stone study · axial · 0.80mm/px · z∈[-487,-12]mm · 14 of 105 slices shown, 16 images]
[im 5/105  soft-tissue]
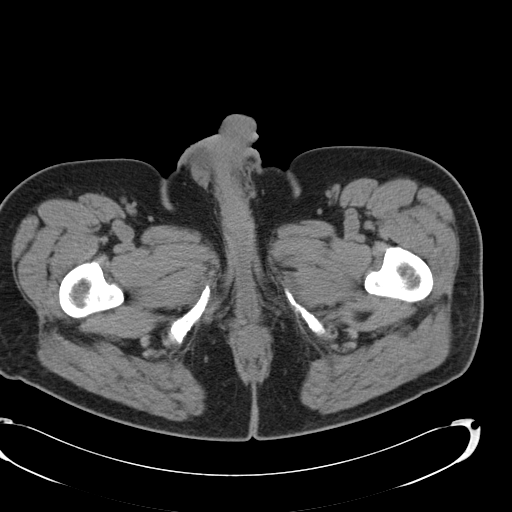
[im 5/105  bone]
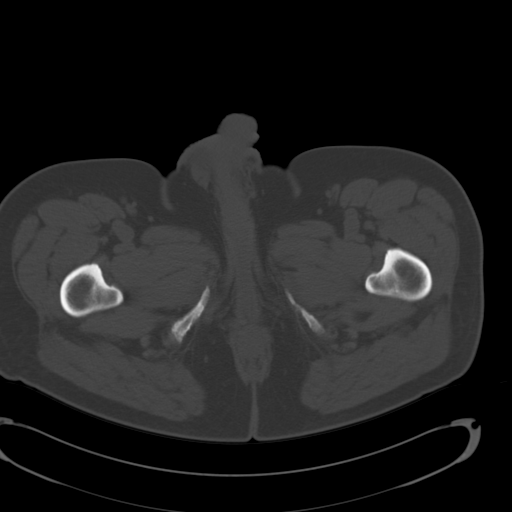
[im 13/105  soft-tissue]
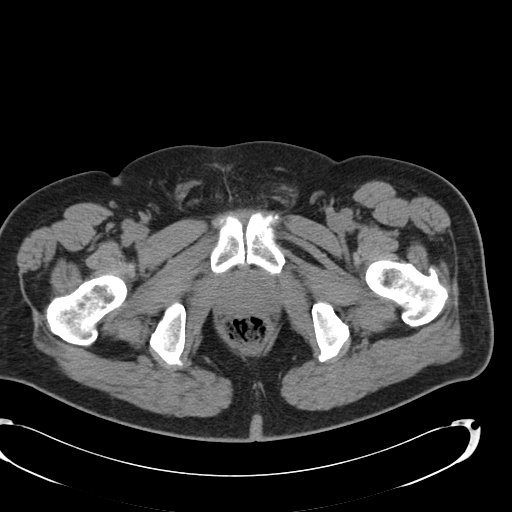
[im 21/105  soft-tissue]
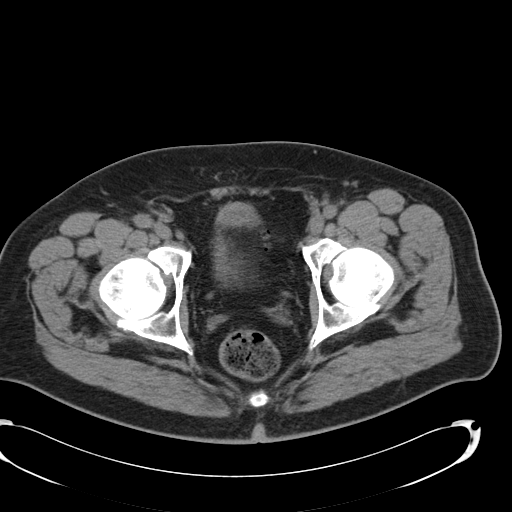
[im 30/105  soft-tissue]
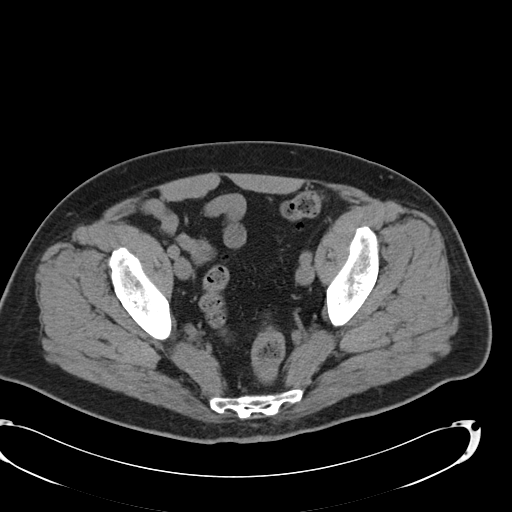
[im 34/105  soft-tissue]
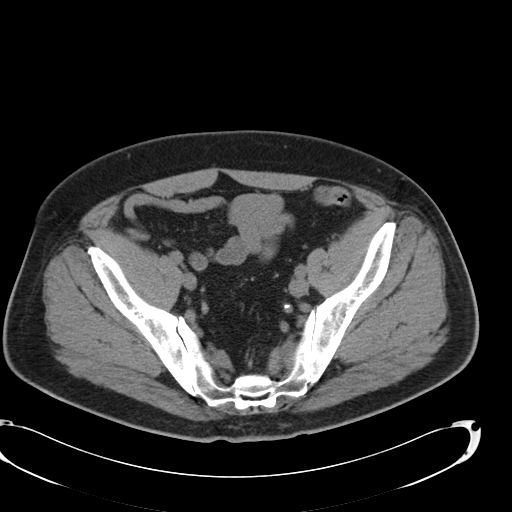
[im 42/105  soft-tissue]
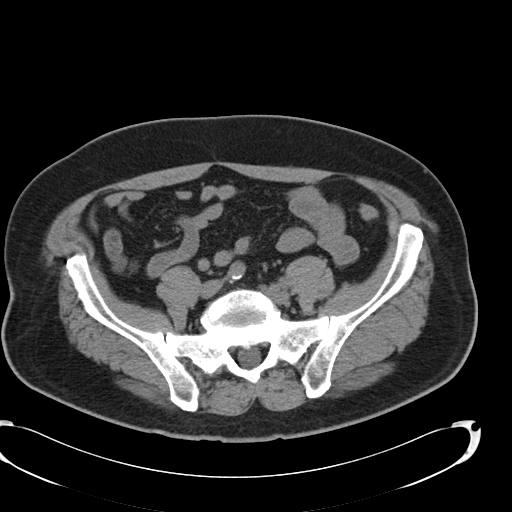
[im 50/105  soft-tissue]
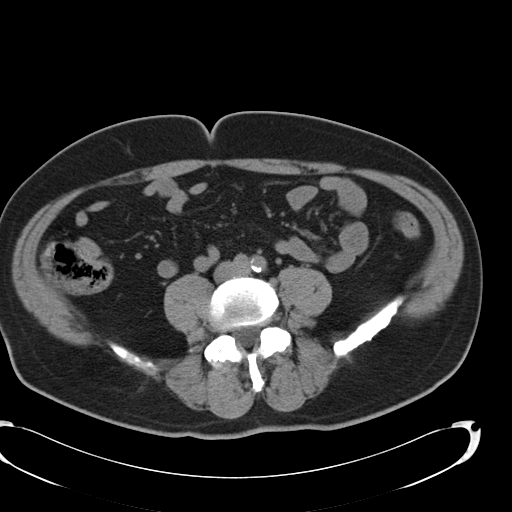
[im 55/105  soft-tissue]
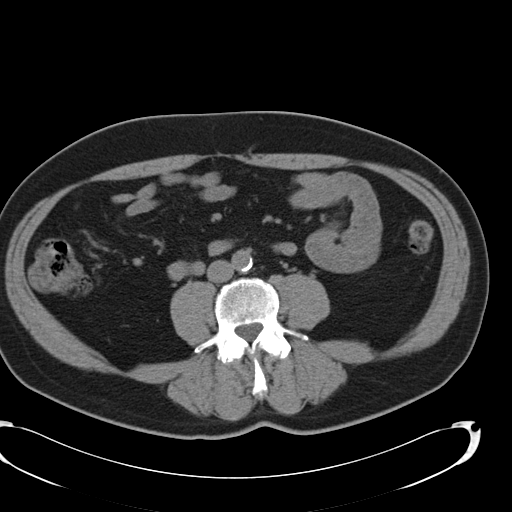
[im 63/105  soft-tissue]
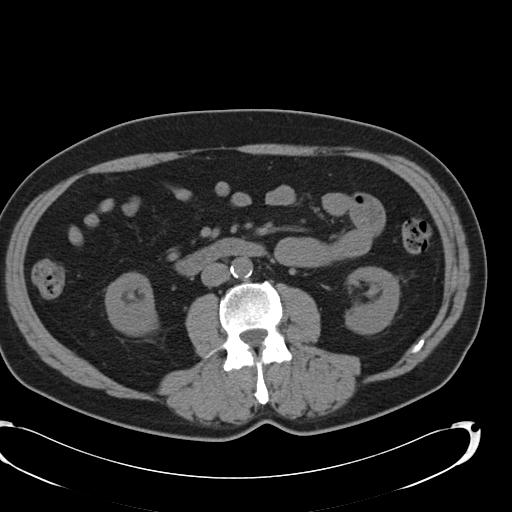
[im 63/105  bone]
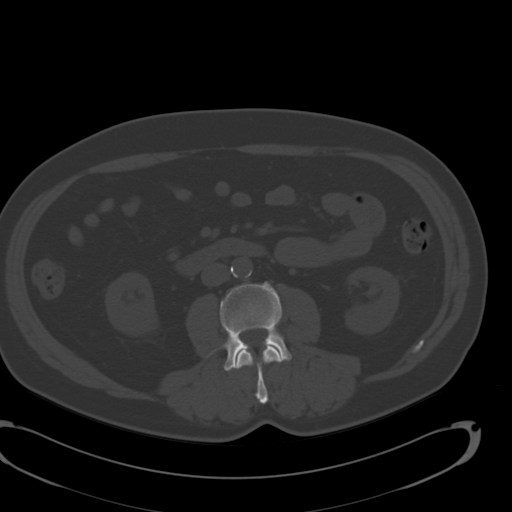
[im 71/105  soft-tissue]
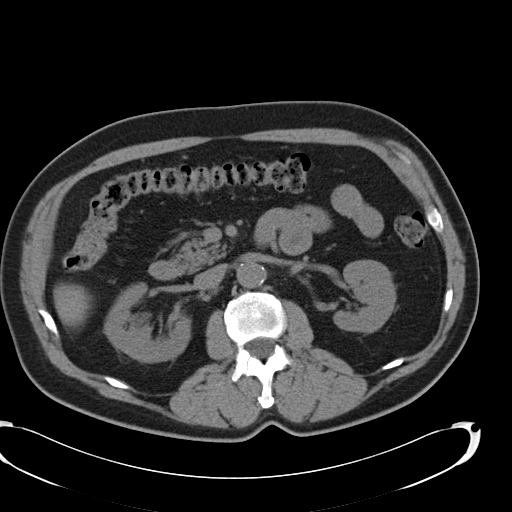
[im 80/105  soft-tissue]
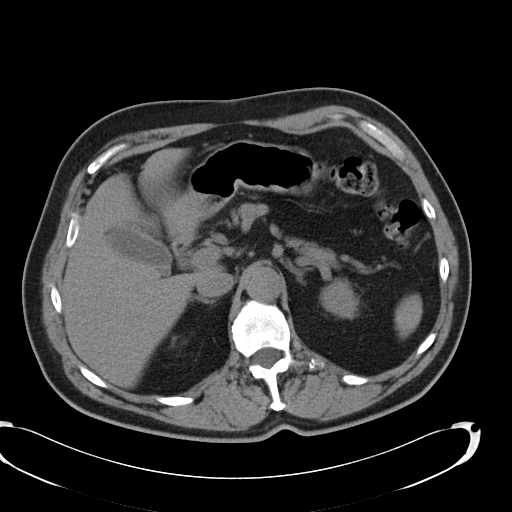
[im 84/105  soft-tissue]
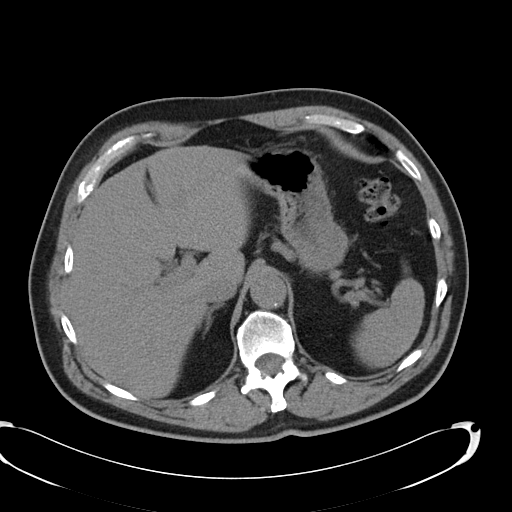
[im 92/105  soft-tissue]
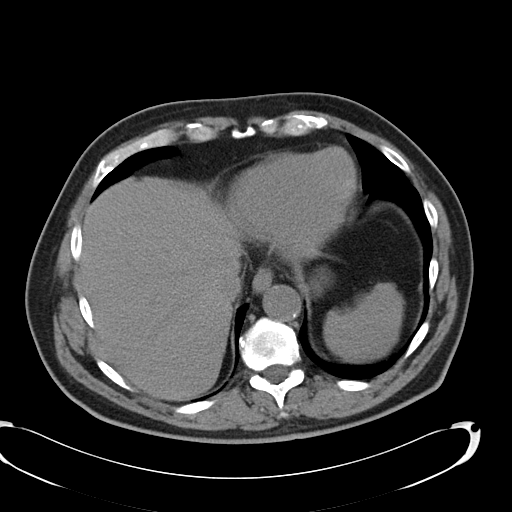
[im 100/105  soft-tissue]
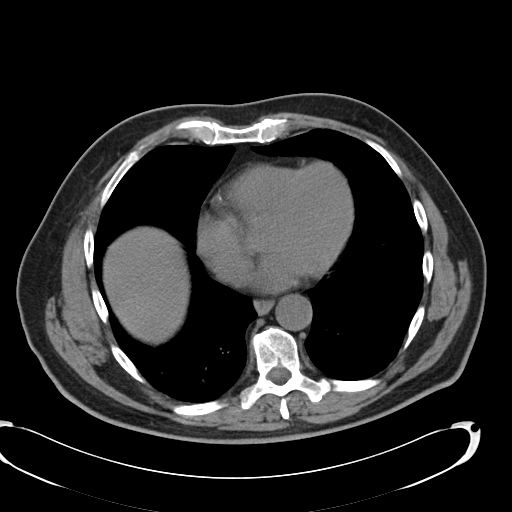

[Series 602: cor · coronal · 1.06mm/px · 3 of 136 slices shown]
[im 46/136  soft-tissue]
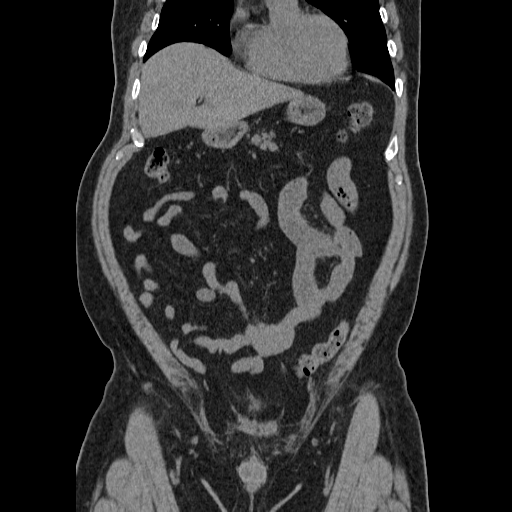
[im 61/136  soft-tissue]
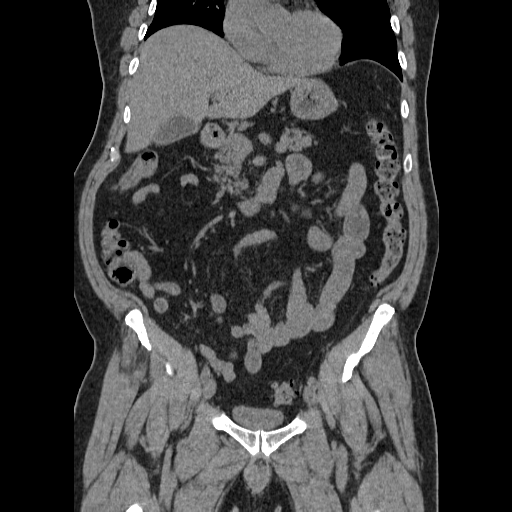
[im 76/136  soft-tissue]
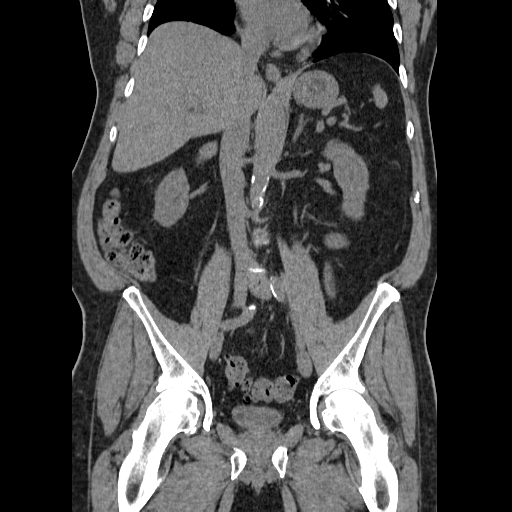

[17 of 46 positions shown; findings below may reference images not displayed]

FINDINGS: No evidence of renal calculi or hydronephrosis.  No
evidence of ureteral calculi or dilatation.  No bladder calculi
identified.  Prostate and seminal vesicles are normal in size.

No renal masses are identified on this noncontrast study.
Noncontrast images of the liver, gallbladder, pancreas, spleen, and
adrenal glands are also normal appearance.  No soft tissue masses
or lymphadenopathy seen elsewhere within the abdomen or pelvis.

Diverticulosis is again seen involving the sigmoid colon, however
there is no evidence of diverticulitis.  No other inflammatory
process or abnormal fluid collections are identified.  No evidence
of dilated bowel loops.
IMPRESSION: 1.  No evidence of urolithiasis, hydronephrosis, or other acute
findings.
2.   Diverticulosis.  No radiographic evidence of diverticulitis.

## 2014-02-06 NOTE — Telephone Encounter (Signed)
Patient is aware to continue same medication per Dr Sherren Mocha

## 2014-02-06 NOTE — Telephone Encounter (Signed)
Left message on machine for patient to return our call 

## 2014-02-06 NOTE — Telephone Encounter (Signed)
Pt returned James [name] call @ 2:32 pm 02/06/14 she was not available

## 2014-03-09 ENCOUNTER — Telehealth: Payer: Self-pay | Admitting: Family Medicine

## 2014-03-09 MED ORDER — HYDROCHLOROTHIAZIDE 25 MG PO TABS: ORAL_TABLET | ORAL | Status: AC

## 2014-03-09 MED ORDER — VERAPAMIL HCL ER 240 MG PO CP24: ORAL_CAPSULE | ORAL | Status: AC

## 2014-03-09 MED ORDER — METFORMIN HCL 1000 MG PO TABS: ORAL_TABLET | ORAL | Status: AC

## 2014-03-09 MED ORDER — SIMVASTATIN 40 MG PO TABS: ORAL_TABLET | ORAL | Status: AC

## 2014-03-09 NOTE — Telephone Encounter (Signed)
Pt is requesting 90 day refills verapamil (VERELAN PM) 240 MG 24 hr capsule metFORMIN (GLUCOPHAGE) 1000 MG tablet lisinopril (PRINIVIL,ZESTRIL) 40 MG tablet hydrochlorothiazide (HYDRODIURIL) 25 MG tablet simvastatin (ZOCOR) 40 MG tablet [facility]/ [place] pls make this pt's  Primary pharm from now on

## 2014-05-15 ENCOUNTER — Telehealth: Payer: Self-pay | Admitting: *Deleted

## 2014-05-15 DIAGNOSIS — E785 Hyperlipidemia, unspecified: Secondary | ICD-10-CM

## 2014-05-15 DIAGNOSIS — E119 Type 2 diabetes mellitus without complications: Secondary | ICD-10-CM

## 2014-05-15 NOTE — Telephone Encounter (Signed)
Left message on machine for patient to call back and schedule a lab/office visit  Bmet, a1c, micro albumin, lipid ordered Diabetic bundle

## 2014-08-14 ENCOUNTER — Telehealth: Payer: Self-pay

## 2014-08-14 NOTE — Telephone Encounter (Signed)
Called pt//[email or web address] stating per Medicare, due for 2015 CPX.

## 2015-03-16 ENCOUNTER — Other Ambulatory Visit: Payer: Self-pay | Admitting: Family Medicine

## 2015-04-26 ENCOUNTER — Other Ambulatory Visit: Payer: Self-pay

## 2015-05-06 ENCOUNTER — Other Ambulatory Visit: Payer: Self-pay | Admitting: Family Medicine

## 2016-11-29 ENCOUNTER — Encounter: Payer: Self-pay | Admitting: Gastroenterology

## 2017-07-20 ENCOUNTER — Encounter: Payer: Self-pay | Admitting: Family Medicine
